# Patient Record
Sex: Female | Born: 1955 | Race: White | Hispanic: No | Smoking: Former smoker
Health system: Southern US, Community
[De-identification: ages and names within clinical notes are randomized; demographics above are authoritative.]

## PROBLEM LIST (undated history)

## (undated) DIAGNOSIS — M549 Dorsalgia, unspecified: Secondary | ICD-10-CM

## (undated) DIAGNOSIS — F39 Unspecified mood [affective] disorder: Secondary | ICD-10-CM

## (undated) DIAGNOSIS — G8929 Other chronic pain: Secondary | ICD-10-CM

## (undated) DIAGNOSIS — M810 Age-related osteoporosis without current pathological fracture: Secondary | ICD-10-CM

## (undated) DIAGNOSIS — Z8619 Personal history of other infectious and parasitic diseases: Secondary | ICD-10-CM

## (undated) DIAGNOSIS — Z9889 Other specified postprocedural states: Secondary | ICD-10-CM

## (undated) DIAGNOSIS — C801 Malignant (primary) neoplasm, unspecified: Secondary | ICD-10-CM

## (undated) DIAGNOSIS — R269 Unspecified abnormalities of gait and mobility: Secondary | ICD-10-CM

## (undated) DIAGNOSIS — Z79899 Other long term (current) drug therapy: Secondary | ICD-10-CM

## (undated) DIAGNOSIS — F419 Anxiety disorder, unspecified: Secondary | ICD-10-CM

## (undated) DIAGNOSIS — I10 Essential (primary) hypertension: Secondary | ICD-10-CM

## (undated) DIAGNOSIS — E7800 Pure hypercholesterolemia, unspecified: Secondary | ICD-10-CM

## (undated) DIAGNOSIS — R87610 Atypical squamous cells of undetermined significance on cytologic smear of cervix (ASC-US): Secondary | ICD-10-CM

## (undated) DIAGNOSIS — C189 Malignant neoplasm of colon, unspecified: Secondary | ICD-10-CM

## (undated) DIAGNOSIS — R251 Tremor, unspecified: Secondary | ICD-10-CM

## (undated) HISTORY — DX: Personal history of other infectious and parasitic diseases: Z86.19

## (undated) HISTORY — PX: COLON SURGERY: SHX602

## (undated) HISTORY — DX: Age-related osteoporosis without current pathological fracture: M81.0

## (undated) HISTORY — DX: Tremor, unspecified: R25.1

## (undated) HISTORY — DX: Atypical squamous cells of undetermined significance on cytologic smear of cervix (ASC-US): R87.610

## (undated) HISTORY — DX: Unspecified abnormalities of gait and mobility: R26.9

## (undated) HISTORY — DX: Other long term (current) drug therapy: Z79.899

## (undated) HISTORY — DX: Unspecified mood (affective) disorder: F39

---

## 2011-07-05 DIAGNOSIS — F39 Unspecified mood [affective] disorder: Secondary | ICD-10-CM | POA: Insufficient documentation

## 2011-09-14 DIAGNOSIS — G894 Chronic pain syndrome: Secondary | ICD-10-CM | POA: Insufficient documentation

## 2013-05-11 DIAGNOSIS — Z720 Tobacco use: Secondary | ICD-10-CM | POA: Insufficient documentation

## 2014-08-08 DIAGNOSIS — R55 Syncope and collapse: Secondary | ICD-10-CM | POA: Insufficient documentation

## 2014-11-29 DIAGNOSIS — R072 Precordial pain: Secondary | ICD-10-CM | POA: Insufficient documentation

## 2015-02-13 DIAGNOSIS — R2689 Other abnormalities of gait and mobility: Secondary | ICD-10-CM | POA: Insufficient documentation

## 2015-02-13 DIAGNOSIS — R159 Full incontinence of feces: Secondary | ICD-10-CM | POA: Insufficient documentation

## 2015-11-03 DIAGNOSIS — M79641 Pain in right hand: Secondary | ICD-10-CM | POA: Insufficient documentation

## 2016-02-25 DIAGNOSIS — M810 Age-related osteoporosis without current pathological fracture: Secondary | ICD-10-CM | POA: Insufficient documentation

## 2017-06-08 ENCOUNTER — Emergency Department (HOSPITAL_BASED_OUTPATIENT_CLINIC_OR_DEPARTMENT_OTHER)
Admission: EM | Admit: 2017-06-08 | Discharge: 2017-06-08 | Disposition: A | Discharge status: Home / Self Care | Payer: Medicare HMO | Attending: Emergency Medicine | Admitting: Emergency Medicine

## 2017-06-08 ENCOUNTER — Encounter (HOSPITAL_BASED_OUTPATIENT_CLINIC_OR_DEPARTMENT_OTHER): Payer: Self-pay

## 2017-06-08 ENCOUNTER — Other Ambulatory Visit: Payer: Self-pay

## 2017-06-08 DIAGNOSIS — I10 Essential (primary) hypertension: Secondary | ICD-10-CM | POA: Diagnosis not present

## 2017-06-08 DIAGNOSIS — Z79899 Other long term (current) drug therapy: Secondary | ICD-10-CM | POA: Insufficient documentation

## 2017-06-08 DIAGNOSIS — F17210 Nicotine dependence, cigarettes, uncomplicated: Secondary | ICD-10-CM | POA: Diagnosis not present

## 2017-06-08 DIAGNOSIS — M79674 Pain in right toe(s): Secondary | ICD-10-CM | POA: Insufficient documentation

## 2017-06-08 HISTORY — DX: Pure hypercholesterolemia, unspecified: E78.00

## 2017-06-08 HISTORY — DX: Essential (primary) hypertension: I10

## 2017-06-08 HISTORY — DX: Other specified postprocedural states: Z98.890

## 2017-06-08 HISTORY — DX: Anxiety disorder, unspecified: F41.9

## 2017-06-08 HISTORY — DX: Malignant (primary) neoplasm, unspecified: C80.1

## 2017-06-08 HISTORY — DX: Other chronic pain: G89.29

## 2017-06-08 HISTORY — DX: Malignant neoplasm of colon, unspecified: C18.9

## 2017-06-08 HISTORY — DX: Dorsalgia, unspecified: M54.9

## 2017-06-08 MED ORDER — DOXYCYCLINE HYCLATE 100 MG PO CAPS: 100.0000 mg | ORAL_CAPSULE | Freq: Two times a day (BID) | ORAL | 0 refills | Status: DC

## 2017-06-08 NOTE — Discharge Instructions (Signed)
You were seen in the emergency department for pain, swelling, and redness to your R big toe. It appears somewhat infected therefore we are putting you on antibiotics- doxycycline, be sure to take this with food. Please take all of your antibiotics until finished. You may develop abdominal discomfort or diarrhea from the antibiotic.  You may help offset this with probiotics which you can buy at the store (ask your pharmacist if unable to find) or get probiotics in the form of eating yogurt. Do not eat or take the probiotics until 2 hours after your antibiotic. If you are unable to tolerate these side effects follow-up with your primary care provider or return to the emergency department.   If you begin to experience any blistering, rashes, swelling, or difficulty breathing seek medical care for evaluation of potentially more serious side effects.   Please be aware that this medication may interact with other medications you are taking, please be sure to discuss your medication list with your pharmacist.   Take motrin or tylenol for discomfort and swelling per over the counter bottle dosing instructions.   Apply warm compresses and perform warm soaks 3 times per day, be sure to dry the area after soaking.   Follow up with the podiatrist listed in your discharge instructions for re-evaluation in 5-7 days. Return to the emergency department for new or worsening symptoms including, but not limited to fever, chills, spreading redness, or any other concerns you may have.   Your blood pressure is somewhat elevated in the emergency department have this rechecked by your primary care provider within the month.   Vitals:   06/08/17 1652  BP: (!) 149/83  Pulse: 90  Resp: 18  Temp: 99.2 F (37.3 C)  SpO2: 98%

## 2017-06-08 NOTE — ED Triage Notes (Signed)
Pt c/o big toenail pain for the last three weeks after her daughter dug under her nails with a metal nail, states her nail is red and swollen

## 2017-06-08 NOTE — ED Provider Notes (Signed)
[facility] EMERGENCY DEPARTMENT Provider Note   CSN: [id number] Arrival date & time: 06/08/17  1648     History   Chief Complaint Chief Complaint  Patient presents with  . Foot Pain    HPI Jessica Larson is a 63 y.o. female with a hx of anxiety, HTN, and tobacco abuse who presents to the ED with complaint of R great toenail discomfort x 3 weeks. Patient states her daughter was cutting her toe nails and used a metal nail to remove some skin at the edge of the nail, no break in the skin occurred- since this incident 3 weeks prior the toe has been uncomfortable. States she developed some swelling and redness. Rates pain a 4/10, worse with pressure or wearing tight shoes, no alleviating factors. Denies fever, chills, or drainage. Denies numbness or weakness.   HPI  Past Medical History:  Diagnosis Date  . Anxiety   . Cancer ([place])   . Chronic back pain   . Colon cancer ([place])   . High cholesterol   . History of loop recorder   . Hypertension     There are no active problems to display for this patient.   Past Surgical History:  Procedure Laterality Date  . COLON SURGERY      OB History    No data available       Home Medications    Prior to Admission medications   Medication Sig Start Date End Date Taking? Authorizing Provider  alprazolam Duanne Moron) 2 MG tablet Take 2 mg by mouth 4 (four) times daily.   Yes [provider]  amitriptyline (ELAVIL) 50 MG tablet Take 50 mg by mouth at bedtime.   Yes [provider]  atenolol (TENORMIN) 50 MG tablet Take 50 mg by mouth 2 (two) times daily.   Yes [provider]  atorvastatin (LIPITOR) 40 MG tablet Take 40 mg by mouth daily.   Yes [provider]  Cholecalciferol (VITAMIN D3) 5000 units TBDP Take by mouth.   Yes [provider]  gabapentin (NEURONTIN) 800 MG tablet Take 800 mg by mouth 4 (four) times daily.   Yes [provider]    Family History History  reviewed. No pertinent family history.  Social History Social History   Tobacco Use  . Smoking status: Heavy Tobacco Smoker  . Smokeless tobacco: Never Used  Substance Use Topics  . Alcohol use: Not on file  . Drug use: Not on file     Allergies   Patient has no known allergies.   Review of Systems Review of Systems  Constitutional: Negative for chills and fever.  Musculoskeletal:       Positive for pain/swelling to R great toe  Skin: Positive for color change (R great toe).  Neurological: Negative for weakness and numbness.     Physical Exam Updated Vital Signs BP (!) 149/83 (BP Location: Left Arm)   Pulse 90   Temp 99.2 F (37.3 C) (Oral)   Resp 18   Ht 5\' 4"  (1.626 m)   Wt 76.7 kg (169 lb)   SpO2 98%   BMI 29.01 kg/m   Physical Exam  Constitutional: She appears well-developed and well-nourished.  Non-toxic appearance. No distress.  HENT:  Head: Normocephalic and atraumatic.  Eyes: Conjunctivae are normal. Right eye exhibits no discharge. Left eye exhibits no discharge.  Cardiovascular: Normal rate and regular rhythm.  No murmur heard. Pulses:      Dorsalis pedis pulses are 2+ on the right side,  and 2+ on the left side.  Pulmonary/Chest: Breath sounds normal. No respiratory distress. She has no wheezes. She has no rales.  Abdominal: Soft. She exhibits no distension. There is no tenderness.  Neurological: She is alert.  Clear speech. Sensation grossly intact to bilateral lower extremities. 5/5 strength with plantar/dorsiflexion bilaterally. Gait intact.   Skin: Skin is warm and dry. Capillary refill takes less than 2 seconds. No rash noted.  R great toe with erythema and swelling to the skin proximal and lateral nail region. This area is tender to palpation. No gross palpable fluctuance. Onychomycotic changes to nails.   Psychiatric: She has a normal mood and affect. Her behavior is normal.  Nursing note and vitals reviewed.    ED Treatments / Results    Labs (all labs ordered are listed, but only abnormal results are displayed) Labs Reviewed - No data to display  EKG  EKG Interpretation None       Radiology No results found.  Procedures Procedures (including critical care time)  Medications Ordered in ED Medications - No data to display   Initial Impression / Assessment and Plan / ED Course  I have reviewed the triage vital signs and the nursing notes.  Pertinent labs & imaging results that were available during my care of the patient were reviewed by me and considered in my medical decision making (see chart for details).   Patient presents with R great toe pain/swelling/erythema. Patient is nontoxic appearing, in no apparent distress, vitals WNL other than somewhat elevated BP- no indication of HTN emergency, discussed with patient need for recheck. Lateral/proximal nail skin with erythema, swelling, and tenderness to palpation. No focal palpable fluctuance. Findings discussed with supervising physician Dr. Lita Mains who personally examined this patient- in agreement with abx therapy and podiatry follow up. Will place patient on doxycycline, recommended warm soaks. I discussed treatment plan, need for PCP and podiatry follow-up, and return precautions with the patient. Provided opportunity for questions, patient confirmed understanding and is in agreement with plan.   Final Clinical Impressions(s) / ED Diagnoses   Final diagnoses:  Great toe pain, right    ED Discharge Orders        Ordered    doxycycline (VIBRAMYCIN) 100 MG capsule  2 times daily     06/08/17 1837       Amaryllis Dyke, PA-C 06/08/17 1903    Julianne Rice, MD 06/11/17 1321

## 2017-09-15 ENCOUNTER — Emergency Department (HOSPITAL_BASED_OUTPATIENT_CLINIC_OR_DEPARTMENT_OTHER)
Admission: EM | Admit: 2017-09-15 | Discharge: 2017-09-15 | Disposition: A | Discharge status: Home / Self Care | Payer: Medicare HMO | Attending: Emergency Medicine | Admitting: Emergency Medicine

## 2017-09-15 ENCOUNTER — Other Ambulatory Visit: Payer: Self-pay

## 2017-09-15 ENCOUNTER — Emergency Department (HOSPITAL_BASED_OUTPATIENT_CLINIC_OR_DEPARTMENT_OTHER): Payer: Medicare HMO

## 2017-09-15 ENCOUNTER — Encounter (HOSPITAL_BASED_OUTPATIENT_CLINIC_OR_DEPARTMENT_OTHER): Payer: Self-pay | Admitting: Emergency Medicine

## 2017-09-15 DIAGNOSIS — M25531 Pain in right wrist: Secondary | ICD-10-CM | POA: Diagnosis not present

## 2017-09-15 DIAGNOSIS — I10 Essential (primary) hypertension: Secondary | ICD-10-CM | POA: Insufficient documentation

## 2017-09-15 DIAGNOSIS — Z87891 Personal history of nicotine dependence: Secondary | ICD-10-CM | POA: Diagnosis not present

## 2017-09-15 DIAGNOSIS — E7800 Pure hypercholesterolemia, unspecified: Secondary | ICD-10-CM | POA: Diagnosis not present

## 2017-09-15 DIAGNOSIS — Z79899 Other long term (current) drug therapy: Secondary | ICD-10-CM | POA: Diagnosis not present

## 2017-09-15 MED ORDER — IBUPROFEN 200 MG PO TABS
600.0000 mg | ORAL_TABLET | Freq: Once | ORAL | Status: AC
  Administered 2017-09-15: 600 mg via ORAL
  Filled 2017-09-15: qty 1

## 2017-09-15 NOTE — ED Provider Notes (Signed)
[facility] EMERGENCY DEPARTMENT Provider Note   CSN: [id number] Arrival date & time: 09/15/17  1607     History   Chief Complaint Chief Complaint  Patient presents with  . Wrist Pain    HPI Jessica Larson is a 62 y.o. female with history of hypertension, chronic back pain, osteoporosis who presents with right wrist pain after catching herself after almost falling on outstretched hand.  She is right-handed.  She began having pain to her right wrist yesterday.  It hurts to move it.  She denies any numbness or tingling.  She took Advil at home without relief.  She also tried heat.  She denies any other injuries.  HPI  Past Medical History:  Diagnosis Date  . Anxiety   . Cancer ([place])   . Chronic back pain   . Colon cancer ([place])   . High cholesterol   . History of loop recorder   . Hypertension     There are no active problems to display for this patient.   Past Surgical History:  Procedure Laterality Date  . COLON SURGERY       OB History   None      Home Medications    Prior to Admission medications   Medication Sig Start Date End Date Taking? Authorizing Provider  alprazolam Duanne Moron) 2 MG tablet Take 2 mg by mouth 4 (four) times daily.    [provider]  amitriptyline (ELAVIL) 50 MG tablet Take 50 mg by mouth at bedtime.    [provider]  atenolol (TENORMIN) 50 MG tablet Take 50 mg by mouth 2 (two) times daily.    [provider]  atorvastatin (LIPITOR) 40 MG tablet Take 40 mg by mouth daily.    [provider]  Cholecalciferol (VITAMIN D3) 5000 units TBDP Take by mouth.    [provider]  doxycycline (VIBRAMYCIN) 100 MG capsule Take 1 capsule (100 mg total) by mouth 2 (two) times daily. 06/08/17   Petrucelli, Samantha R, PA-C  gabapentin (NEURONTIN) 800 MG tablet Take 800 mg by mouth 4 (four) times daily.    [provider]    Family History No family history on file.  Social History Social  History   Tobacco Use  . Smoking status: Former Smoker    Years: 0.00    Last attempt to quit: 09/15/2012    Years since quitting: 5.0  . Smokeless tobacco: Never Used  Substance Use Topics  . Alcohol use: Never    Frequency: Never  . Drug use: Never     Allergies   Patient has no known allergies.   Review of Systems Review of Systems  Musculoskeletal: Positive for arthralgias.  Neurological: Negative for numbness.     Physical Exam Updated Vital Signs BP (!) 146/67 (BP Location: Left Arm)   Pulse 77   Temp 98.9 F (37.2 C) (Oral)   Resp 16   Ht 5\' 5"  (1.651 m)   Wt 77.1 kg (170 lb)   SpO2 97%   BMI 28.29 kg/m   Physical Exam  Constitutional: She appears well-developed and well-nourished. No distress.  HENT:  Head: Normocephalic and atraumatic.  Mouth/Throat: Oropharynx is clear and moist. No oropharyngeal exudate.  Eyes: Pupils are equal, round, and reactive to light. Conjunctivae are normal. Right eye exhibits no discharge. Left eye exhibits no discharge. No scleral icterus.  Neck: Normal range of motion. Neck supple.  Cardiovascular: Normal rate, regular rhythm, normal heart sounds and intact distal pulses. Exam  reveals no gallop and no friction rub.  No murmur heard. Pulmonary/Chest: Effort normal and breath sounds normal. No stridor. No respiratory distress. She has no wheezes. She has no rales.  Musculoskeletal: She exhibits no edema.  R wrist: Tenderness to the dorsal aspect of the right wrist as well as anatomical snuffbox tenderness, pain worse with radial deviation, flexion, and extension, sensation intact, range of motion of digits intact, no significant edema noted  Neurological: She is alert. Coordination normal.  Skin: Skin is warm and dry. No rash noted. She is not diaphoretic. No pallor.  Psychiatric: She has a normal mood and affect.  Nursing note and vitals reviewed.    ED Treatments / Results  Labs (all labs ordered are listed, but only  abnormal results are displayed) Labs Reviewed - No data to display  EKG None  Radiology Dg Wrist Complete Right  Result Date: 09/15/2017 CLINICAL DATA:  Initial evaluation for acute pain at snuffbox status post recent fall. EXAM: RIGHT WRIST - COMPLETE 3+ VIEW COMPARISON:  None. FINDINGS: No acute fracture or dislocation. Normal radiocarpal and distal radioulnar articulations maintained. Corticated osseous fragment at the ulnar styloid is chronic in appearance. Scaphoid intact. No acute soft tissue abnormality. Bones are mildly osteopenic in appearance. IMPRESSION: No acute osseous abnormality about the right wrist. Electronically Signed   By: Jeannine Boga M.D.   On: 09/15/2017 16:55    Procedures Procedures (including critical care time)  SPLINT APPLICATION Date/Time: 4:01 PM Authorized by: Frederica Kuster Consent: Verbal consent obtained. Risks and benefits: risks, benefits and alternatives were discussed Consent given by: patient Splint applied by: EMT Location details: R wrist Splint type: Thumb spica Supplies used: Orthoglass, ACE Post-procedure: The splinted body part was neurovascularly unchanged following the procedure. Patient tolerance: Patient tolerated the procedure well with no immediate complications.     Medications Ordered in ED Medications  ibuprofen (ADVIL,MOTRIN) tablet 600 mg (600 mg Oral Given 09/15/17 1634)     Initial Impression / Assessment and Plan / ED Course  I have reviewed the triage vital signs and the nursing notes.  Pertinent labs & imaging results that were available during my care of the patient were reviewed by me and considered in my medical decision making (see chart for details).     Patient with right wrist pain after [place] a few days ago.  X-ray is negative, however considering anatomical snuffbox tenderness, will place in thumb spica and follow-up to sports medicine for repeat x-ray.  Supportive treatment discussed including  ice, elevation, NSAIDs, Tylenol.  Return precautions discussed.  Patient understands and agrees with plan.  Patient vitals stable throughout ED course and discharged in satisfactory condition. I discussed patient case with Dr. Venora Maples who guided the patient's management and agrees with plan.   Final Clinical Impressions(s) / ED Diagnoses   Final diagnoses:  Right wrist pain    ED Discharge Orders    None       Caryl Ada 09/15/17 1740    Jola Schmidt, MD 09/16/17 0010

## 2017-09-15 NOTE — ED Notes (Signed)
During application of splint patient would not keep wrist  straight and non compliant during EMT placing splint. EMT had to tell her several times to stay still and kept wrist straight or it will hurt worse. Patient kept stating well it hurts now. EMT reassured patient that it hurts while splint it being placed but once its in a splint it will not hurt anymore.

## 2017-09-15 NOTE — ED Notes (Signed)
Pt returned from xray

## 2017-09-15 NOTE — ED Triage Notes (Signed)
Right wrist pain that started yesterday with no known injury.

## 2017-09-15 NOTE — ED Notes (Signed)
Pt instructed on RICE and alternating tylenol and ibuprofen, denies any further needs at this time

## 2017-09-15 NOTE — Discharge Instructions (Addendum)
Keep splint on until you see Dr. Barbaraann Barthel for repeat x-ray and further evaluation of your wrist pain.  Please see him in 7 to 10 days.  You can take ibuprofen or Tylenol as prescribed over-the-counter, as needed for your pain.  Elevate your wrist whenever you are not using it.  Please return the emergency department if you develop any new or worsening symptoms.

## 2017-09-15 NOTE — ED Notes (Signed)
Per registration, pt's ibuprofen has not helped

## 2017-09-15 NOTE — ED Notes (Signed)
ED Provider at bedside. 

## 2018-02-27 DIAGNOSIS — Z79899 Other long term (current) drug therapy: Secondary | ICD-10-CM | POA: Insufficient documentation

## 2018-10-03 ENCOUNTER — Telehealth: Payer: Self-pay | Admitting: *Deleted

## 2018-10-03 ENCOUNTER — Ambulatory Visit: Payer: Medicare HMO | Admitting: Neurology

## 2018-10-03 ENCOUNTER — Encounter: Payer: Self-pay | Admitting: Neurology

## 2018-10-03 NOTE — Telephone Encounter (Signed)
No showed new patient appointment. 

## 2018-10-06 DIAGNOSIS — F411 Generalized anxiety disorder: Secondary | ICD-10-CM | POA: Insufficient documentation

## 2018-10-06 DIAGNOSIS — F1320 Sedative, hypnotic or anxiolytic dependence, uncomplicated: Secondary | ICD-10-CM | POA: Diagnosis present

## 2018-10-06 DIAGNOSIS — Z62820 Parent-biological child conflict: Secondary | ICD-10-CM | POA: Insufficient documentation

## 2018-11-14 ENCOUNTER — Emergency Department (HOSPITAL_COMMUNITY): Payer: Medicare HMO

## 2018-11-14 ENCOUNTER — Encounter (HOSPITAL_COMMUNITY): Payer: Self-pay | Admitting: Emergency Medicine

## 2018-11-14 ENCOUNTER — Inpatient Hospital Stay (HOSPITAL_COMMUNITY)
Admission: EM | Admit: 2018-11-14 | Discharge: 2018-11-17 | DRG: 392 | Disposition: A | Discharge status: Home / Self Care | Payer: Medicare HMO | Attending: Internal Medicine | Admitting: Internal Medicine

## 2018-11-14 ENCOUNTER — Other Ambulatory Visit: Payer: Self-pay

## 2018-11-14 DIAGNOSIS — Z79899 Other long term (current) drug therapy: Secondary | ICD-10-CM

## 2018-11-14 DIAGNOSIS — Z79891 Long term (current) use of opiate analgesic: Secondary | ICD-10-CM

## 2018-11-14 DIAGNOSIS — I10 Essential (primary) hypertension: Secondary | ICD-10-CM | POA: Diagnosis present

## 2018-11-14 DIAGNOSIS — I700 Atherosclerosis of aorta: Secondary | ICD-10-CM | POA: Diagnosis present

## 2018-11-14 DIAGNOSIS — R160 Hepatomegaly, not elsewhere classified: Secondary | ICD-10-CM | POA: Diagnosis present

## 2018-11-14 DIAGNOSIS — Z85038 Personal history of other malignant neoplasm of large intestine: Secondary | ICD-10-CM

## 2018-11-14 DIAGNOSIS — R109 Unspecified abdominal pain: Secondary | ICD-10-CM | POA: Diagnosis not present

## 2018-11-14 DIAGNOSIS — R1110 Vomiting, unspecified: Secondary | ICD-10-CM

## 2018-11-14 DIAGNOSIS — R112 Nausea with vomiting, unspecified: Secondary | ICD-10-CM | POA: Diagnosis not present

## 2018-11-14 DIAGNOSIS — Z7951 Long term (current) use of inhaled steroids: Secondary | ICD-10-CM

## 2018-11-14 DIAGNOSIS — R911 Solitary pulmonary nodule: Secondary | ICD-10-CM | POA: Diagnosis present

## 2018-11-14 DIAGNOSIS — G8929 Other chronic pain: Secondary | ICD-10-CM | POA: Diagnosis present

## 2018-11-14 DIAGNOSIS — R197 Diarrhea, unspecified: Secondary | ICD-10-CM | POA: Diagnosis present

## 2018-11-14 DIAGNOSIS — M549 Dorsalgia, unspecified: Secondary | ICD-10-CM | POA: Diagnosis present

## 2018-11-14 DIAGNOSIS — K760 Fatty (change of) liver, not elsewhere classified: Secondary | ICD-10-CM | POA: Diagnosis present

## 2018-11-14 DIAGNOSIS — Z87891 Personal history of nicotine dependence: Secondary | ICD-10-CM

## 2018-11-14 DIAGNOSIS — Z20828 Contact with and (suspected) exposure to other viral communicable diseases: Secondary | ICD-10-CM | POA: Diagnosis present

## 2018-11-14 DIAGNOSIS — F419 Anxiety disorder, unspecified: Secondary | ICD-10-CM | POA: Diagnosis present

## 2018-11-14 DIAGNOSIS — E785 Hyperlipidemia, unspecified: Secondary | ICD-10-CM | POA: Diagnosis present

## 2018-11-14 LAB — COMPREHENSIVE METABOLIC PANEL
ALT: 15 U/L (ref 0–44)
AST: 15 U/L (ref 15–41)
Albumin: 4 g/dL (ref 3.5–5.0)
Alkaline Phosphatase: 52 U/L (ref 38–126)
Anion gap: 8 (ref 5–15)
BUN: 5 mg/dL — ABNORMAL LOW (ref 8–23)
CO2: 26 mmol/L (ref 22–32)
Calcium: 9 mg/dL (ref 8.9–10.3)
Chloride: 106 mmol/L (ref 98–111)
Creatinine, Ser: 0.59 mg/dL (ref 0.44–1.00)
GFR calc Af Amer: >60 mL/min (ref >60)
GFR calc non Af Amer: >60 mL/min (ref >60)
Glucose, Bld: 96 mg/dL (ref 70–99)
Potassium: 3.4 mmol/L — ABNORMAL LOW (ref 3.5–5.1)
Sodium: 140 mmol/L (ref 135–145)
Total Bilirubin: 0.5 mg/dL (ref 0.3–1.2)
Total Protein: 7.1 g/dL (ref 6.5–8.1)

## 2018-11-14 LAB — CBC WITH DIFFERENTIAL/PLATELET
Abs Immature Granulocytes: 0.06 10*3/uL (ref 0.00–0.07)
Basophils Absolute: 0.1 10*3/uL (ref 0.0–0.1)
Basophils Relative: 1 %
Eosinophils Absolute: 0 10*3/uL (ref 0.0–0.5)
Eosinophils Relative: 0 %
HCT: 37.9 % (ref 36.0–46.0)
Hemoglobin: 12.6 g/dL (ref 12.0–15.0)
Immature Granulocytes: 1 %
Lymphocytes Relative: 22 %
Lymphs Abs: 2 10*3/uL (ref 0.7–4.0)
MCH: 31.1 pg (ref 26.0–34.0)
MCHC: 33.2 g/dL (ref 30.0–36.0)
MCV: 93.6 fL (ref 80.0–100.0)
Monocytes Absolute: 0.5 10*3/uL (ref 0.1–1.0)
Monocytes Relative: 5 %
Neutro Abs: 6.4 10*3/uL (ref 1.7–7.7)
Neutrophils Relative %: 71 %
Platelets: 267 10*3/uL (ref 150–400)
RBC: 4.05 MIL/uL (ref 3.87–5.11)
RDW: 13.1 % (ref 11.5–15.5)
WBC: 9 10*3/uL (ref 4.0–10.5)
nRBC: 0 % (ref 0.0–0.2)

## 2018-11-14 LAB — URINALYSIS, ROUTINE W REFLEX MICROSCOPIC
Bacteria, UA: NONE SEEN
Bilirubin Urine: NEGATIVE
Glucose, UA: NEGATIVE mg/dL
Hgb urine dipstick: NEGATIVE
Ketones, ur: 5 mg/dL — AB
Leukocytes,Ua: NEGATIVE
Nitrite: NEGATIVE
Protein, ur: NEGATIVE mg/dL
Specific Gravity, Urine: 1.006 (ref 1.005–1.030)
pH: 8 (ref 5.0–8.0)

## 2018-11-14 LAB — RAPID URINE DRUG SCREEN, HOSP PERFORMED
Amphetamines: NOT DETECTED
Barbiturates: NOT DETECTED
Benzodiazepines: POSITIVE — AB
Cocaine: NOT DETECTED
Opiates: POSITIVE — AB
Tetrahydrocannabinol: NOT DETECTED

## 2018-11-14 LAB — TROPONIN I (HIGH SENSITIVITY): Troponin I (High Sensitivity): 4 ng/L (ref <18)

## 2018-11-14 LAB — LIPASE, BLOOD: Lipase: 18 U/L (ref 11–51)

## 2018-11-14 MED ORDER — SODIUM CHLORIDE 0.9 % IV SOLN: INTRAVENOUS | Status: DC

## 2018-11-14 MED ORDER — ONDANSETRON HCL 4 MG/2ML IJ SOLN
4.0000 mg | Freq: Once | INTRAMUSCULAR | Status: AC
  Administered 2018-11-14: 19:00:00 4 mg via INTRAVENOUS
  Filled 2018-11-14: qty 2

## 2018-11-14 MED ORDER — MORPHINE SULFATE (PF) 4 MG/ML IV SOLN
4.0000 mg | Freq: Once | INTRAVENOUS | Status: AC
  Administered 2018-11-14: 4 mg via INTRAVENOUS
  Filled 2018-11-14: qty 1

## 2018-11-14 MED ORDER — ACETAMINOPHEN 325 MG PO TABS
650.0000 mg | ORAL_TABLET | Freq: Four times a day (QID) | ORAL | Status: DC | PRN
  Administered 2018-11-15 (×2): 650 mg via ORAL
  Filled 2018-11-14 (×2): qty 2

## 2018-11-14 MED ORDER — HALOPERIDOL LACTATE 5 MG/ML IJ SOLN
1.0000 mg | Freq: Once | INTRAMUSCULAR | Status: AC
  Administered 2018-11-14: 19:00:00 1 mg via INTRAVENOUS
  Filled 2018-11-14: qty 1

## 2018-11-14 MED ORDER — PANTOPRAZOLE SODIUM 40 MG PO TBEC: 40.0000 mg | DELAYED_RELEASE_TABLET | Freq: Two times a day (BID) | ORAL | Status: DC | PRN

## 2018-11-14 MED ORDER — GABAPENTIN 400 MG PO CAPS
800.0000 mg | ORAL_CAPSULE | Freq: Three times a day (TID) | ORAL | Status: DC
  Administered 2018-11-14 – 2018-11-17 (×6): 800 mg via ORAL
  Filled 2018-11-14 (×7): qty 2

## 2018-11-14 MED ORDER — CYCLOBENZAPRINE HCL 10 MG PO TABS
10.0000 mg | ORAL_TABLET | Freq: Every day | ORAL | Status: DC | PRN
  Administered 2018-11-15: 10 mg via ORAL
  Filled 2018-11-14: qty 1

## 2018-11-14 MED ORDER — SODIUM CHLORIDE 0.9 % IV BOLUS
1000.0000 mL | Freq: Once | INTRAVENOUS | Status: AC
Start: 2018-11-14 — End: 2018-11-14
  Administered 2018-11-14: 16:00:00 1000 mL via INTRAVENOUS

## 2018-11-14 MED ORDER — AMITRIPTYLINE HCL 25 MG PO TABS
50.0000 mg | ORAL_TABLET | Freq: Every day | ORAL | Status: DC
  Administered 2018-11-14 – 2018-11-16 (×3): 50 mg via ORAL
  Filled 2018-11-14 (×3): qty 2

## 2018-11-14 MED ORDER — ACETAMINOPHEN 650 MG RE SUPP: 650.0000 mg | Freq: Four times a day (QID) | RECTAL | Status: DC | PRN

## 2018-11-14 MED ORDER — ALPRAZOLAM 0.5 MG PO TABS
2.0000 mg | ORAL_TABLET | Freq: Once | ORAL | Status: AC
  Administered 2018-11-14: 20:00:00 2 mg via ORAL
  Filled 2018-11-14: qty 4

## 2018-11-14 MED ORDER — PROMETHAZINE HCL 25 MG/ML IJ SOLN: 12.5000 mg | Freq: Once | INTRAMUSCULAR | Status: DC

## 2018-11-14 MED ORDER — POTASSIUM CHLORIDE IN NACL 20-0.9 MEQ/L-% IV SOLN
INTRAVENOUS | Status: AC
  Administered 2018-11-14: 22:00:00 via INTRAVENOUS
  Filled 2018-11-14: qty 1000

## 2018-11-14 MED ORDER — ATENOLOL 50 MG PO TABS
50.0000 mg | ORAL_TABLET | Freq: Two times a day (BID) | ORAL | Status: DC
  Administered 2018-11-15 – 2018-11-16 (×2): 50 mg via ORAL
  Filled 2018-11-14 (×6): qty 1

## 2018-11-14 MED ORDER — PROMETHAZINE HCL 25 MG/ML IJ SOLN
12.5000 mg | Freq: Four times a day (QID) | INTRAMUSCULAR | Status: DC | PRN
  Administered 2018-11-14 – 2018-11-15 (×2): 12.5 mg via INTRAVENOUS
  Filled 2018-11-14 (×2): qty 1

## 2018-11-14 MED ORDER — ENOXAPARIN SODIUM 40 MG/0.4ML ~~LOC~~ SOLN
40.0000 mg | SUBCUTANEOUS | Status: DC
  Administered 2018-11-14 – 2018-11-16 (×3): 40 mg via SUBCUTANEOUS
  Filled 2018-11-14 (×3): qty 0.4

## 2018-11-14 MED ORDER — ONDANSETRON HCL 4 MG/2ML IJ SOLN
4.0000 mg | Freq: Once | INTRAMUSCULAR | Status: AC
  Administered 2018-11-14: 4 mg via INTRAVENOUS
  Filled 2018-11-14: qty 2

## 2018-11-14 MED ORDER — ALPRAZOLAM 1 MG PO TABS
2.0000 mg | ORAL_TABLET | Freq: Four times a day (QID) | ORAL | Status: DC
  Administered 2018-11-14 – 2018-11-17 (×10): 2 mg via ORAL
  Filled 2018-11-14 (×10): qty 2

## 2018-11-14 MED ORDER — ONDANSETRON HCL 4 MG/2ML IJ SOLN: 4.0000 mg | Freq: Four times a day (QID) | INTRAMUSCULAR | Status: DC | PRN

## 2018-11-14 NOTE — H&P (Addendum)
TRH H&P    Patient Demographics:    Jessica Larson, is a 63 y.o. female  MRN: [id number]  DOB - 29-Mar-1956  Admit Date - 11/14/2018  Referring MD/NP/PA:  Marye Round  Outpatient Primary MD for the patient is System, Pcp Not In  Patient coming from: home  Chief complaint- nausea and vommitting   HPI:    Jessica Larson  is a 63 y.o. female,  w anxiety, hypertension, hyperlipidemia, chronic back pain, apparently presents with c/o n/v, since last Sunday. Slight diarrhea, intermittent.  Pt denies fever, chills, cough, cp, palp, sob, brbpr, black stool.  Pt denies opioid withdrawal.  Pt denies nsaids.  Pt states that she has had prior colonoscopy w polyps, and egd w esohpageal dilation. Pt denies dysphagia at this time.   In ED,  T 98.7  Po 63  R 20  Bp 147/67 pox 98% Wt 72.4kg  Xray Abd IMPRESSION: 1. Nonobstructive bowel gas pattern. 2. Possible hepatomegaly.  The length of the liver measures 21 cm. 3. Diffuse coarsening of the interstitium may be due to chronic interstitial lung changes.    Na 140, K 3.4, Bun 5, Creatinine 0.59 Ast 15, Alt 15, Alk phos 52. T bili 0.5 Wbc 9.0, Hgb 12.6, Plt 267 Urinalysis negative Lipase 18  Review of visit at [facility] 11/10/2018 CT chest / Abd/ pelvis CHEST - Thoracic inlet: Visualized thyroid is unremarkable. The central airways patent. - Heart: Heart size is within normal limits. No pericardial effusion. - Pulmonary arteries/Vessels: No evidence of pulmonary embolism. No acute thoracic aortic abnormality. No aneurysm.  Atherosclerosis of multiple arterial structures. - Mediastinum/hila: Similar patulous esophagus with air-fluid level. Right hilar node measures 11 mm in short axis. - Lungs: Micronodular nodularity in the right lower lobe with scattered groundglass opacities in the lung bases. Nodular densities measure up to 9 mm in the right lower lobe (series 5, image  72). Additional 8 mm nodule in the right lower lobe (series 5,  image 60). 6 mm nodule on image 55. - Pleura: No pleural effusion. No pneumothorax.  ABDOMEN: - Liver: Hepatic steatosis with fatty sparing along the gallbladder fossa. - Gallbladder/biliary system: Within normal limits. - Spleen: Within normal limits. - Pancreas: Within normal limits. - Adrenal glands: Within normal limits. - Kidneys: Suspect nonobstructing bilateral nephrolithiasis versus possible early contrast excretion. No hydronephrosis. - GI tract: No bowel obstruction. Mildly patulous esophagus. Possible tiny hiatal hernia. No abnormal fecal retention. - Appendix: No acute appendicitis.  - Peritoneum/retroperitoneum: No free fluid. No pneumoperitoneum. Small retroperitoneal lymph nodes are nonspecific. - Vessels: Atherosclerosis of the abdominal aorta and branch vessels.  PELVIS: - Bladder: Within normal limits. - Ureters: Within normal limits. - Reproductive: Within normal limits.  MUSCULOSKELETAL: - No acute osseous findings. Implanted foreign body in the left chest. Mild bilateral hip degenerative changes. Multilevel degenerative changes of the spine. Levoconvex curvature of the lumbar spine.  SUPPORT APPARATUS: N.A.   IMPRESSION: 1.  Groundglass and nodular opacities in the lung bases presumably relate to infectious/inflammatory etiology. However, nodules measure up to 9  mm. Recommend follow-up CT after treatment to document resolution. 2.  No evidence of acute pulmonary embolism. 3.  Hepatic steatosis. 4.  Atherosclerosis.  Pt will be admitted for intractable n/v.        Review of systems:    In addition to the HPI above,  No Fever-chills, No Headache, No changes with Vision or hearing, No problems swallowing food or Liquids, No Chest pain, Cough or Shortness of Breath,  No Blood in stool or Urine, No dysuria, No new skin rashes or bruises, No new joints pains-aches,  No new weakness,  tingling, numbness in any extremity, No recent weight gain or loss, No polyuria, polydypsia or polyphagia, No significant Mental Stressors.  All other systems reviewed and are negative.    Past History of the following :    Past Medical History:  Diagnosis Date  . Anxiety   . Cancer ([place])   . Chronic back pain   . Colon cancer ([place])   . High cholesterol   . History of loop recorder   . Hypertension       Past Surgical History:  Procedure Laterality Date  . COLON SURGERY        Social History:      Social History   Tobacco Use  . Smoking status: Former Smoker    Years: 0.00    Quit date: 09/15/2012    Years since quitting: 6.1  . Smokeless tobacco: Never Used  Substance Use Topics  . Alcohol use: Never    Frequency: Never       Family History :    No family history on file. Negative for inflammatory bowel disease   Home Medications:   Prior to Admission medications   Medication Sig Start Date End Date Taking? Authorizing Provider  alprazolam Duanne Moron) 2 MG tablet Take 2 mg by mouth 4 (four) times daily.   Yes [provider]  amitriptyline (ELAVIL) 50 MG tablet Take 50 mg by mouth at bedtime.   Yes [provider]  atenolol (TENORMIN) 50 MG tablet Take 50 mg by mouth 2 (two) times daily.   Yes [provider]  Cholecalciferol (VITAMIN D3) 5000 units TBDP Take 1 tablet by mouth daily.    Yes [provider]  cyclobenzaprine (FLEXERIL) 10 MG tablet Take 10 mg by mouth daily as needed for muscle spasms.  11/04/18  Yes [provider]  fluticasone (FLONASE) 50 MCG/ACT nasal spray Place 1 spray into both nostrils daily as needed for allergies.  06/20/18  Yes [provider]  gabapentin (NEURONTIN) 800 MG tablet Take 800 mg by mouth 3 (three) times daily.    Yes [provider]  HYDROcodone-acetaminophen (NORCO/VICODIN) 5-325 MG tablet Take 1 tablet by mouth every 6 (six) hours as needed for moderate pain  or severe pain.  11/10/18  Yes [provider]  pantoprazole (PROTONIX) 40 MG tablet Take 40 mg by mouth 2 (two) times daily as needed (indigestion).  09/02/18  Yes [provider]  promethazine (PHENERGAN) 25 MG tablet Take 25 mg by mouth every 8 (eight) hours as needed for nausea or vomiting.  10/16/18  Yes [provider]  doxycycline (VIBRAMYCIN) 100 MG capsule Take 1 capsule (100 mg total) by mouth 2 (two) times daily. Patient not taking: Reported on 11/14/2018 06/08/17   Petrucelli, Glynda Jaeger, PA-C     Allergies:    No Known Allergies   Physical Exam:   Vitals  Blood pressure (!) 144/59, pulse (!) 56, temperature  98.8 F (37.1 C), temperature source Oral, resp. rate 20, height '5\' 4"'  (1.626 m), weight 72.4 kg, SpO2 92 %.  1.  General: aoxo3  2. Psychiatric: euthymic  3. Neurologic: cn2-12 intact, reflexes 2+ symmetric, diffuse with downgoing toes bilaterally, motor 5/5 in all 4 ext  4. HEENMT:  Anicteric, pupils 1.4m symmetric, direct, consensual, near intact Neck: no jvd  5. Respiratory : CTAB  6. Cardiovascular : rrr s1, s2,   7. Gastrointestinal:  Abd: soft, nt, nd, +bs  8. Skin:  Ext:  No c/c/e,    9.Musculoskeletal:  Good ROM,  No adenopathy    Data Review:    CBC Recent Labs  Lab 11/14/18 1608  WBC 9.0  HGB 12.6  HCT 37.9  PLT 267  MCV 93.6  MCH 31.1  MCHC 33.2  RDW 13.1  LYMPHSABS 2.0  MONOABS 0.5  EOSABS 0.0  BASOSABS 0.1   ------------------------------------------------------------------------------------------------------------------  Results for orders placed or performed during the hospital encounter of 11/14/18 (from the past 48 hour(s))  Comprehensive metabolic panel     Status: Abnormal   Collection Time: 11/14/18  4:08 PM  Result Value Ref Range   Sodium 140 135 - 145 mmol/L   Potassium 3.4 (L) 3.5 - 5.1 mmol/L   Chloride 106 98 - 111 mmol/L   CO2 26 22 - 32 mmol/L   Glucose, Bld 96 70 - 99 mg/dL    BUN 5 (L) 8 - 23 mg/dL   Creatinine, Ser 0.59 0.44 - 1.00 mg/dL   Calcium 9.0 8.9 - 10.3 mg/dL   Total Protein 7.1 6.5 - 8.1 g/dL   Albumin 4.0 3.5 - 5.0 g/dL   AST 15 15 - 41 U/L   ALT 15 0 - 44 U/L   Alkaline Phosphatase 52 38 - 126 U/L   Total Bilirubin 0.5 0.3 - 1.2 mg/dL   GFR calc non Af Amer >60 >60 mL/min   GFR calc Af Amer >60 >60 mL/min   Anion gap 8 5 - 15    Comment: [facility] [address] [place] [id number] Lipase, blood     Status: None   Collection Time: 11/14/18  4:08 PM  Result Value Ref Range   Lipase 18 11 - 51 U/L    Comment: [facility]formed at [facility] [address] [place] [id number] CBC with Diff     Status: None   Collection Time: 11/14/18  4:08 PM  Result Value Ref Range   WBC 9.0 4.0 - 10.5 K/uL   RBC 4.05 3.87 - 5.11 MIL/uL   Hemoglobin 12.6 12.0 - 15.0 g/dL   HCT 37.9 36.0 - 46.0 %   MCV 93.6 80.0 - 100.0 fL   MCH 31.1 26.0 - 34.0 pg   MCHC 33.2 30.0 - 36.0 g/dL   RDW 13.1 11.5 - 15.5 %   Platelets 267 150 - 400 K/uL   nRBC 0.0 0.0 - 0.2 %   Neutrophils Relative % 71 %   Neutro Abs 6.4 1.7 - 7.7 K/uL   Lymphocytes Relative 22 %   Lymphs Abs 2.0 0.7 - 4.0 K/uL   Monocytes Relative 5 %   Monocytes Absolute 0.5 0.1 - 1.0 K/uL   Eosinophils Relative 0 %   Eosinophils Absolute 0.0 0.0 - 0.5 K/uL   Basophils Relative 1 %   Basophils Absolute 0.1 0.0 - 0.1 K/uL   Immature Granulocytes 1 %   Abs Immature Granulocytes  0.06 0.00 - 0.07 K/uL    Comment: Performed at [facility], [place] [address], [place], [place] [address]  Troponin I (High Sensitivity)     Status: None   Collection Time: 11/14/18  6:54 PM  Result Value Ref Range   Troponin I (High Sensitivity) 4 <18 ng/L    Comment: (NOTE) Elevated high sensitivity troponin I (hsTnI) values and significant  changes across serial measurements may suggest ACS but many other  chronic and acute conditions  are known to elevate hsTnI results.  Refer to the "Links" section for chest pain algorithms and additional  guidance. Performed at [facility], [place] [address] [place], [place] [address]   Urinalysis, Routine w reflex microscopic     Status: Abnormal   Collection Time: 11/14/18  7:45 PM  Result Value Ref Range   Color, Urine STRAW (A) YELLOW   APPearance CLEAR CLEAR   Specific Gravity, Urine 1.006 1.005 - 1.030   pH 8.0 5.0 - 8.0   Glucose, UA NEGATIVE NEGATIVE mg/dL   Hgb urine dipstick NEGATIVE NEGATIVE   Bilirubin Urine NEGATIVE NEGATIVE   Ketones, ur 5 (A) NEGATIVE mg/dL   Protein, ur NEGATIVE NEGATIVE mg/dL   Nitrite NEGATIVE NEGATIVE   Leukocytes,Ua NEGATIVE NEGATIVE   RBC / HPF 0-5 0 - 5 RBC/hpf   WBC, UA 0-5 0 - 5 WBC/hpf   Bacteria, UA NONE SEEN NONE SEEN   Squamous Epithelial / LPF 0-5 0 - 5    Comment: Performed at [facility], [place] [address] [place], [place] [address]  Rapid urine drug screen (hospital performed)     Status: Abnormal   Collection Time: 11/14/18  7:45 PM  Result Value Ref Range   Opiates POSITIVE (A) NONE DETECTED   Cocaine NONE DETECTED NONE DETECTED   Benzodiazepines POSITIVE (A) NONE DETECTED   Amphetamines NONE DETECTED NONE DETECTED   Tetrahydrocannabinol NONE DETECTED NONE DETECTED   Barbiturates NONE DETECTED NONE DETECTED    Comment: (NOTE) DRUG SCREEN FOR MEDICAL PURPOSES ONLY.  IF CONFIRMATION IS NEEDED FOR ANY PURPOSE, NOTIFY LAB WITHIN 5 DAYS. LOWEST DETECTABLE LIMITS FOR URINE DRUG SCREEN Drug Class                     Cutoff (ng/mL) Amphetamine and metabolites    1000 Barbiturate and metabolites    200 Benzodiazepine                 798 Tricyclics and metabolites     300 Opiates and metabolites        300 Cocaine and metabolites        300 THC                            50 Performed at [facility], [place] [address], [place], [place] [address]     Chemistries   Recent Labs  Lab 11/14/18 1608  NA 140  K 3.4*  CL 106  CO2 26  GLUCOSE 96  BUN 5*  CREATININE 0.59  CALCIUM 9.0  AST 15  ALT 15  ALKPHOS 52  BILITOT 0.5   ------------------------------------------------------------------------------------------------------------------  ------------------------------------------------------------------------------------------------------------------ GFR: Estimated Creatinine Clearance: 69.3 mL/min (by C-G formula based on SCr of 0.59 mg/dL). Liver Function Tests: Recent Labs  Lab 11/14/18 1608  AST 15  ALT 15  ALKPHOS 52  BILITOT 0.5  PROT 7.1  ALBUMIN 4.0   Recent Labs  Lab 11/14/18 1608  LIPASE 18   No results for input(s): AMMONIA in the last 168 hours. Coagulation Profile: No results for input(s): INR, PROTIME in the last 168 hours. Cardiac Enzymes: No results for input(s): CKTOTAL, CKMB, CKMBINDEX, TROPONINI in the last 168 hours. BNP (last 3 results) No results for input(s): PROBNP in the last 8760 hours. HbA1C: No results for input(s): HGBA1C in the last 72 hours. CBG: No results for input(s): GLUCAP in the last 168 hours. Lipid Profile: No results for input(s): CHOL, HDL, LDLCALC, TRIG, CHOLHDL, LDLDIRECT in the last 72 hours. Thyroid Function Tests: No results for input(s): TSH, T4TOTAL, FREET4, T3FREE, THYROIDAB in the last 72 hours. Anemia Panel: No results for input(s): VITAMINB12, FOLATE, FERRITIN, TIBC, IRON, RETICCTPCT in the last 72 hours.  --------------------------------------------------------------------------------------------------------------- Urine analysis:    Component Value Date/Time   COLORURINE STRAW (A) 11/14/2018 1945   APPEARANCEUR CLEAR 11/14/2018 1945   LABSPEC 1.006 11/14/2018 1945   PHURINE 8.0 11/14/2018 1945   GLUCOSEU NEGATIVE 11/14/2018 1945   HGBUR NEGATIVE 11/14/2018 1945   BILIRUBINUR NEGATIVE 11/14/2018 1945   KETONESUR 5 (A) 11/14/2018 1945   PROTEINUR NEGATIVE  11/14/2018 1945   NITRITE NEGATIVE 11/14/2018 1945   LEUKOCYTESUR NEGATIVE 11/14/2018 1945      Imaging Results:    Dg Abd Acute W/chest  Result Date: 11/14/2018 CLINICAL DATA:  Left upper quadrant abdominal pain. EXAM: DG ABDOMEN ACUTE W/ 1V CHEST COMPARISON:  None. FINDINGS: There is no evidence of dilated bowel loops or free intraperitoneal air. No radiopaque calculi. Prominent size of the liver. Heart size and mediastinal contours are within normal limits. Loop recorder in place. Calcific atherosclerotic disease of the aorta. Diffuse coarsening of the interstitium. IMPRESSION: 1. Nonobstructive bowel gas pattern. 2. Possible hepatomegaly.  The length of the liver measures 21 cm. 3. Diffuse coarsening of the interstitium may be due to chronic interstitial lung changes. Electronically Signed   By: Fidela Salisbury M.D.   On: 11/14/2018 16:05      Assessment & Plan:    Active Problems:   Nausea & vomiting   Chronic back pain   Essential hypertension   Pulmonary nodule  Nausea and vomitting NPO except for sips with medications Cont PPI Zofran 65m iv q6h prn  Phenergan prn if Zofran ineffective Consider gastric emptying study Consider GI consult in am if patient not feeling better  Hypertension Cont Atenolol 587mpo bid  Hepatomegaly, Fatty liver RUQ ultrasound  Chronic back pain Cont Gabapentin 8007mo tid Cont Flexeril 25m27m tid prn   Anxiety Cont Xanax 2mg 81mqid Cont Elavil 50mg 1mhs  Pulmonary Nodule Outpatient follow up please  DVT Prophylaxis-   Lovenox - SCDs   AM Labs Ordered, also please review Full Orders  Family Communication: Admission, patients condition and plan of care including tests being ordered have been discussed with the patient  who indicate understanding and agree with the plan and Code Status.  Code Status:  FULL CODE  Admission status: Observation: Based on patients clinical presentation and evaluation of above clinical data, I  have made determination that patient meets Observation criteria at this time.  Time spent in minutes : 70   Jessica Araque Jani Graveln 11/14/2018 at 9:47 PM

## 2018-11-14 NOTE — ED Notes (Signed)
Called and gave Jessica Larson for this pt going to 1407. Transport taking pt to room now.

## 2018-11-14 NOTE — ED Triage Notes (Signed)
Per EMS, patient from home, c/o LUQ abdominal pain N/V/D x4 days. Seen at [facility] for same x4 days ago. States worsening. Reports prescribed oxycodone and phenergan however unable to keep down. Refused to walk with EMS.  20g R AC 4mg  Zofran R AC

## 2018-11-14 NOTE — ED Notes (Signed)
ED TO INPATIENT HANDOFF REPORT  ED Nurse Name and Phone #: [place], [place] Name/Age/Gender Jessica Larson 63 y.o. female Room/Bed: WA22/WA22  Code Status   Code Status: Full Code  Home/SNF/Other Home Patient oriented to: self, place, time and situation Is this baseline? Yes   Triage Complete: Triage complete  Chief Complaint abdominal pain  Triage Note Per EMS, patient from home, c/o LUQ abdominal pain N/V/D x4 days. Seen at [facility] for same x4 days ago. States worsening. Reports prescribed oxycodone and phenergan however unable to keep down. Refused to walk with EMS.  20g R AC 4mg  Zofran R AC    Allergies No Known Allergies  Level of Care/Admitting Diagnosis ED Disposition    ED Disposition Condition Comment   Admit  Hospital Area: [place] [[id number]]  Level of Care: Telemetry [5]  Admit to tele based on following criteria: Monitor for Ischemic changes  Covid Evaluation: Asymptomatic Screening Protocol (No Symptoms)  Diagnosis: Nausea & vomiting [676195]  Admitting Physician: Jani Gravel [[id number]]  Attending Physician: Jani Gravel [[id number]]  PT Class (Do Not Modify): Observation [104]  PT Acc Code (Do Not Modify): Observation [10022]       B Medical/Surgery History Past Medical History:  Diagnosis Date  . Anxiety   . Cancer ([place])   . Chronic back pain   . Colon cancer ([place])   . High cholesterol   . History of loop recorder   . Hypertension    Past Surgical History:  Procedure Laterality Date  . COLON SURGERY       A IV Location/Drains/Wounds Patient Lines/Drains/Airways Status   Active Line/Drains/Airways    Name:   Placement date:   Placement time:   Site:   Days:   Peripheral IV 11/14/18 Left Forearm   11/14/18    1616    Forearm   less than 1          Intake/Output Last 24 hours  Intake/Output Summary (Last 24 hours) at 11/14/2018 1940 Last data filed at 11/14/2018 1938 Gross per 24 hour  Intake 1000 ml  Output -   Net 1000 ml    Labs/Imaging Results for orders placed or performed during the hospital encounter of 11/14/18 (from the past 48 hour(s))  Comprehensive metabolic panel     Status: Abnormal   Collection Time: 11/14/18  4:08 PM  Result Value Ref Range   Sodium 140 135 - 145 mmol/L   Potassium 3.4 (L) 3.5 - 5.1 mmol/L   Chloride 106 98 - 111 mmol/L   CO2 26 22 - 32 mmol/L   Glucose, Bld 96 70 - 99 mg/dL   BUN 5 (L) 8 - 23 mg/dL   Creatinine, Ser 0.59 0.44 - 1.00 mg/dL   Calcium 9.0 8.9 - 10.3 mg/dL   Total Protein 7.1 6.5 - 8.1 g/dL   Albumin 4.0 3.5 - 5.0 g/dL   AST 15 15 - 41 U/L   ALT 15 0 - 44 U/L   Alkaline Phosphatase 52 38 - 126 U/L   Total Bilirubin 0.5 0.3 - 1.2 mg/dL   GFR calc non Af Amer >60 >60 mL/min   GFR calc Af Amer >60 >60 mL/min   Anion gap 8 5 - 15    Comment: Performed at [facility], [place] [address] [place], [place] [address]  Lipase, blood     Status: None   Collection Time: 11/14/18  4:08 PM  Result Value Ref Range   Lipase 18  11 - 51 U/L    Comment: Performed at [facility], [place] [address], [place], [place] [address]  CBC with Diff     Status: None   Collection Time: 11/14/18  4:08 PM  Result Value Ref Range   WBC 9.0 4.0 - 10.5 K/uL   RBC 4.05 3.87 - 5.11 MIL/uL   Hemoglobin 12.6 12.0 - 15.0 g/dL   HCT 37.9 36.0 - 46.0 %   MCV 93.6 80.0 - 100.0 fL   MCH 31.1 26.0 - 34.0 pg   MCHC 33.2 30.0 - 36.0 g/dL   RDW 13.1 11.5 - 15.5 %   Platelets 267 150 - 400 K/uL   nRBC 0.0 0.0 - 0.2 %   Neutrophils Relative % 71 %   Neutro Abs 6.4 1.7 - 7.7 K/uL   Lymphocytes Relative 22 %   Lymphs Abs 2.0 0.7 - 4.0 K/uL   Monocytes Relative 5 %   Monocytes Absolute 0.5 0.1 - 1.0 K/uL   Eosinophils Relative 0 %   Eosinophils Absolute 0.0 0.0 - 0.5 K/uL   Basophils Relative 1 %   Basophils Absolute 0.1 0.0 - 0.1 K/uL   Immature Granulocytes 1 %   Abs Immature Granulocytes 0.06 0.00 - 0.07 K/uL    Comment: Performed at  [facility], [place] [address] [place], [place] [address]   Dg Abd Acute W/chest  Result Date: 11/14/2018 CLINICAL DATA:  Left upper quadrant abdominal pain. EXAM: DG ABDOMEN ACUTE W/ 1V CHEST COMPARISON:  None. FINDINGS: There is no evidence of dilated bowel loops or free intraperitoneal air. No radiopaque calculi. Prominent size of the liver. Heart size and mediastinal contours are within normal limits. Loop recorder in place. Calcific atherosclerotic disease of the aorta. Diffuse coarsening of the interstitium. IMPRESSION: 1. Nonobstructive bowel gas pattern. 2. Possible hepatomegaly.  The length of the liver measures 21 cm. 3. Diffuse coarsening of the interstitium may be due to chronic interstitial lung changes. Electronically Signed   By: Fidela Salisbury M.D.   On: 11/14/2018 16:05    Pending Labs Unresulted Labs (From admission, onward)    Start     Ordered   11/21/18 0500  Creatinine, serum  (enoxaparin (LOVENOX)    CrCl >/= 30 ml/min)  Weekly,   R    Comments: while on enoxaparin therapy    11/14/18 1856   11/15/18 0500  Comprehensive metabolic panel  Tomorrow morning,   R     11/14/18 1856   11/15/18 0500  CBC  Tomorrow morning,   R     11/14/18 1856   11/14/18 1855  Rapid urine drug screen (hospital performed)  ONCE - STAT,   STAT     11/14/18 1854   11/14/18 1854  HIV antibody (Routine Testing)  Once,   STAT     11/14/18 1856   11/14/18 1845  SARS CORONAVIRUS 2 Nasal Swab Aptima Multi Swab  (Asymptomatic/Tier 2 Patients Labs)  Once,   STAT    Question Answer Comment  Is this test for diagnosis or screening Screening   Symptomatic for COVID-19 as defined by CDC No   Hospitalized for COVID-19 No   Admitted to ICU for COVID-19 No   Previously tested for COVID-19 No   Resident in a congregate (group) care setting No   Employed in healthcare setting No   Pregnant No      11/14/18 1844   11/14/18 1525  Urinalysis, Routine w reflex microscopic  ONCE -  STAT,   STAT     11/14/18 1524          Vitals/Pain Today's Vitals   11/14/18 1435 11/14/18 1438 11/14/18 1700 11/14/18 1745  BP:  (!) 147/67 (!) 145/59 (!) 157/52  Pulse:  63 (!) 57 66  Resp:  20  16  Temp:  98.7 F (37.1 C)    SpO2:  98% 100% 100%  PainSc: 10-Worst pain ever       Isolation Precautions No active isolations  Medications Medications  sodium chloride 0.9 % bolus 1,000 mL (0 mLs Intravenous Stopped 11/14/18 1938)    And  0.9 %  sodium chloride infusion (has no administration in time range)  ALPRAZolam (XANAX) tablet 2 mg (has no administration in time range)  promethazine (PHENERGAN) injection 12.5 mg (has no administration in time range)  enoxaparin (LOVENOX) injection 40 mg (has no administration in time range)  acetaminophen (TYLENOL) tablet 650 mg (has no administration in time range)    Or  acetaminophen (TYLENOL) suppository 650 mg (has no administration in time range)  0.9 % NaCl with KCl 20 mEq/ L  infusion (has no administration in time range)  ondansetron (ZOFRAN) injection 4 mg (has no administration in time range)  promethazine (PHENERGAN) injection 12.5 mg (has no administration in time range)  morphine 4 MG/ML injection 4 mg (4 mg Intravenous Given 11/14/18 1609)  ondansetron (ZOFRAN) injection 4 mg (4 mg Intravenous Given 11/14/18 1608)  ondansetron (ZOFRAN) injection 4 mg (4 mg Intravenous Given 11/14/18 1900)  morphine 4 MG/ML injection 4 mg (4 mg Intravenous Given 11/14/18 1902)  haloperidol lactate (HALDOL) injection 1 mg (1 mg Intravenous Given 11/14/18 1900)    Mobility walks Low fall risk

## 2018-11-14 NOTE — ED Provider Notes (Signed)
[facility] DEPT Provider Note   CSN: [id number] Arrival date & time: 11/14/18  1426    History   Chief Complaint Chief Complaint  Patient presents with   Abdominal Pain    HPI Jessica Larson is a 63 y.o. female.     HPI Pt presents to the ED for trouble with persistent nausea and vomiting.  THis has been ongoing for several days.  Pt states she is throwing up dry phlegm.  Pt denies any diarrhea with these symptoms.  SHe does have pain in her left upper abdomen to her back.  Pt states she is also feeling anxious.   She takes xanax regularly.  SHe vomited up her last dose.  Pt feels like she is getting very anxous.  SHe was seen at [facility] a few days ago.  SHe had CT scans of her chest and abdomen.  Pt came to the ED here because of the wait at [facility]. Past Medical History:  Diagnosis Date   Anxiety    Cancer ([place])    Chronic back pain    Colon cancer ([place])    High cholesterol    History of loop recorder    Hypertension     There are no active problems to display for this patient.   Past Surgical History:  Procedure Laterality Date   COLON SURGERY       OB History   No obstetric history on file.      Home Medications    Prior to Admission medications   Medication Sig Start Date End Date Taking? Authorizing Provider  alprazolam Duanne Moron) 2 MG tablet Take 2 mg by mouth 4 (four) times daily.   Yes [provider]  amitriptyline (ELAVIL) 50 MG tablet Take 50 mg by mouth at bedtime.   Yes [provider]  atenolol (TENORMIN) 50 MG tablet Take 50 mg by mouth 2 (two) times daily.   Yes [provider]  Cholecalciferol (VITAMIN D3) 5000 units TBDP Take 1 tablet by mouth daily.    Yes [provider]  cyclobenzaprine (FLEXERIL) 10 MG tablet Take 10 mg by mouth daily as needed for muscle spasms.  11/04/18  Yes [provider]  fluticasone (FLONASE) 50 MCG/ACT nasal spray Place 1 spray into  both nostrils daily as needed for allergies.  06/20/18  Yes [provider]  gabapentin (NEURONTIN) 800 MG tablet Take 800 mg by mouth 3 (three) times daily.    Yes [provider]  HYDROcodone-acetaminophen (NORCO/VICODIN) 5-325 MG tablet Take 1 tablet by mouth every 6 (six) hours as needed for moderate pain or severe pain.  11/10/18  Yes [provider]  pantoprazole (PROTONIX) 40 MG tablet Take 40 mg by mouth 2 (two) times daily as needed (indigestion).  09/02/18  Yes [provider]  promethazine (PHENERGAN) 25 MG tablet Take 25 mg by mouth every 8 (eight) hours as needed for nausea or vomiting.  10/16/18  Yes [provider]  doxycycline (VIBRAMYCIN) 100 MG capsule Take 1 capsule (100 mg total) by mouth 2 (two) times daily. Patient not taking: Reported on 11/14/2018 06/08/17   Petrucelli, Glynda Jaeger, PA-C    Family History No family history on file.  Social History Social History   Tobacco Use   Smoking status: Former Smoker    Years: 0.00    Quit date: 09/15/2012    Years since quitting: 6.1   Smokeless tobacco: Never Used  Substance Use Topics   Alcohol use: Never  Frequency: Never   Drug use: Never     Allergies   Patient has no known allergies.   Review of Systems Review of Systems  All other systems reviewed and are negative.    Physical Exam Updated Vital Signs BP (!) 157/52    Pulse 66    Temp 98.7 F (37.1 C)    Resp 16    SpO2 100%   Physical Exam Vitals signs and nursing note reviewed.  Constitutional:      Appearance: She is well-developed. She is ill-appearing.  HENT:     Head: Normocephalic and atraumatic.     Right Ear: External ear normal.     Left Ear: External ear normal.  Eyes:     General: No scleral icterus.       Right eye: No discharge.        Left eye: No discharge.     Conjunctiva/sclera: Conjunctivae normal.  Neck:     Musculoskeletal: Neck supple.     Trachea: No tracheal deviation.    Cardiovascular:     Rate and Rhythm: Normal rate and regular rhythm.  Pulmonary:     Effort: Pulmonary effort is normal. No respiratory distress.     Breath sounds: Normal breath sounds. No stridor. No wheezing or rales.  Abdominal:     General: Bowel sounds are normal. There is no distension.     Palpations: Abdomen is soft.     Tenderness: There is abdominal tenderness in the epigastric area. There is no guarding or rebound.  Musculoskeletal:        General: No tenderness.  Skin:    General: Skin is warm and dry.     Findings: No rash.  Neurological:     Mental Status: She is alert.     Cranial Nerves: No cranial nerve deficit (no facial droop, extraocular movements intact, no slurred speech).     Sensory: No sensory deficit.     Motor: No abnormal muscle tone or seizure activity.     Coordination: Coordination normal.      ED Treatments / Results  Labs (all labs ordered are listed, but only abnormal results are displayed) Labs Reviewed  COMPREHENSIVE METABOLIC PANEL - Abnormal; Notable for the following components:      Result Value   Potassium 3.4 (*)    BUN 5 (*)    All other components within normal limits  SARS CORONAVIRUS 2  LIPASE, BLOOD  CBC WITH DIFFERENTIAL/PLATELET  URINALYSIS, ROUTINE W REFLEX MICROSCOPIC    EKG None  Radiology Dg Abd Acute W/chest  Result Date: 11/14/2018 CLINICAL DATA:  Left upper quadrant abdominal pain. EXAM: DG ABDOMEN ACUTE W/ 1V CHEST COMPARISON:  None. FINDINGS: There is no evidence of dilated bowel loops or free intraperitoneal air. No radiopaque calculi. Prominent size of the liver. Heart size and mediastinal contours are within normal limits. Loop recorder in place. Calcific atherosclerotic disease of the aorta. Diffuse coarsening of the interstitium. IMPRESSION: 1. Nonobstructive bowel gas pattern. 2. Possible hepatomegaly.  The length of the liver measures 21 cm. 3. Diffuse coarsening of the interstitium may be due to chronic  interstitial lung changes. Electronically Signed   By: Fidela Salisbury M.D.   On: 11/14/2018 16:05    Procedures Procedures (including critical care time)  Medications Ordered in ED Medications  sodium chloride 0.9 % bolus 1,000 mL (1,000 mLs Intravenous New Bag/Given 11/14/18 1617)    And  0.9 %  sodium chloride infusion (has no administration in time  range)  ALPRAZolam (XANAX) tablet 2 mg (has no administration in time range)  ondansetron (ZOFRAN) injection 4 mg (has no administration in time range)  morphine 4 MG/ML injection 4 mg (has no administration in time range)  promethazine (PHENERGAN) injection 12.5 mg (has no administration in time range)  haloperidol lactate (HALDOL) injection 1 mg (has no administration in time range)  morphine 4 MG/ML injection 4 mg (4 mg Intravenous Given 11/14/18 1609)  ondansetron (ZOFRAN) injection 4 mg (4 mg Intravenous Given 11/14/18 1608)     Initial Impression / Assessment and Plan / ED Course  I have reviewed the triage vital signs and the nursing notes.  Pertinent labs & imaging results that were available during my care of the patient were reviewed by me and considered in my medical decision making (see chart for details).  Clinical Course as of Nov 14 1843  Tue Nov 14, 2018  1515 CT scan [facility] chest and abd at [facility] on 8[facility]13: IMPRESSION: 1. Groundglass and nodular opacities in the lung bases presumably relate to infectious/inflammatory etiology. However, nodules measure up to 9 mm. Recommend follow-up CT after treatment to document resolution. 2. No evidence of acute pulmonary embolism. 3. Hepatic steatosis. 4. Atherosclerosis.   [HE]  1740 Pt has asked several times for something for her nerves.  Explained to her we will start for something for the nausea and pain and see if that helps.   [CX]  4481 EHUDJSHFW with daughter.  Pt is switching PCPs.  She has another rx of hyrdrocodone to pick up.  She still has hr xanax rx.    [YO]    3785 Daughter states she has been having diarrhea for months.   Many of these symptoms have been ongoing for months.   [YI]  5027 Labs reviewed.  No acute changes noted.  Urinalysis is pending.   [XA]  1287 Acute abdominal series without signs of obstruction.   [OM]  7672 Patient tried to drink fluids.  She started vomiting again.   [JK]    Clinical Course User Index [JK] Dorie Rank, MD     Patient presents with persistent abdominal pain and vomiting.  Patient was just seen a few days ago at another medical facility.  The records were reviewed.  Patient had a CT scan of her chest as well as her abdomen and pelvis.  There is some nodules noted in the lungs but no acute finding was noted in her abdominal pelvic region.  Patient continues to vomit here.  She continues to complain of pain.  She has not been able to keep down anything orally.  I am primarily seeing dry heaves and small amounts of emesis.  Wonder if the patient may be having some issues with gastroparesis.  She is on chronic opiates and benzodiazepines and I wonder if that could be a factor although the patient's daughter states she has not run out of any of her medications.  As the patient is unable to keep down any fluids I will consult the medical service to see if we can bring her in overnight for hydration and observation.  Final Clinical Impressions(s) / ED Diagnoses   Final diagnoses:  Intractable vomiting, presence of nausea not specified, unspecified vomiting type  Abdominal pain, unspecified abdominal location      Dorie Rank, MD 11/14/18 [phone]

## 2018-11-15 ENCOUNTER — Observation Stay (HOSPITAL_COMMUNITY): Payer: Medicare HMO

## 2018-11-15 ENCOUNTER — Encounter (HOSPITAL_COMMUNITY): Payer: Self-pay | Admitting: *Deleted

## 2018-11-15 DIAGNOSIS — R911 Solitary pulmonary nodule: Secondary | ICD-10-CM

## 2018-11-15 DIAGNOSIS — Z7951 Long term (current) use of inhaled steroids: Secondary | ICD-10-CM | POA: Diagnosis not present

## 2018-11-15 DIAGNOSIS — F419 Anxiety disorder, unspecified: Secondary | ICD-10-CM | POA: Diagnosis present

## 2018-11-15 DIAGNOSIS — M549 Dorsalgia, unspecified: Secondary | ICD-10-CM | POA: Diagnosis not present

## 2018-11-15 DIAGNOSIS — I10 Essential (primary) hypertension: Secondary | ICD-10-CM

## 2018-11-15 DIAGNOSIS — Z85038 Personal history of other malignant neoplasm of large intestine: Secondary | ICD-10-CM | POA: Diagnosis not present

## 2018-11-15 DIAGNOSIS — K760 Fatty (change of) liver, not elsewhere classified: Secondary | ICD-10-CM | POA: Diagnosis present

## 2018-11-15 DIAGNOSIS — R112 Nausea with vomiting, unspecified: Secondary | ICD-10-CM | POA: Diagnosis present

## 2018-11-15 DIAGNOSIS — Z87891 Personal history of nicotine dependence: Secondary | ICD-10-CM | POA: Diagnosis not present

## 2018-11-15 DIAGNOSIS — Z79899 Other long term (current) drug therapy: Secondary | ICD-10-CM | POA: Diagnosis not present

## 2018-11-15 DIAGNOSIS — G8929 Other chronic pain: Secondary | ICD-10-CM

## 2018-11-15 DIAGNOSIS — E785 Hyperlipidemia, unspecified: Secondary | ICD-10-CM | POA: Diagnosis present

## 2018-11-15 DIAGNOSIS — Z79891 Long term (current) use of opiate analgesic: Secondary | ICD-10-CM | POA: Diagnosis not present

## 2018-11-15 DIAGNOSIS — R197 Diarrhea, unspecified: Secondary | ICD-10-CM | POA: Diagnosis present

## 2018-11-15 DIAGNOSIS — R160 Hepatomegaly, not elsewhere classified: Secondary | ICD-10-CM | POA: Diagnosis present

## 2018-11-15 DIAGNOSIS — I700 Atherosclerosis of aorta: Secondary | ICD-10-CM | POA: Diagnosis present

## 2018-11-15 DIAGNOSIS — R109 Unspecified abdominal pain: Secondary | ICD-10-CM | POA: Diagnosis present

## 2018-11-15 DIAGNOSIS — Z20828 Contact with and (suspected) exposure to other viral communicable diseases: Secondary | ICD-10-CM | POA: Diagnosis present

## 2018-11-15 LAB — SARS CORONAVIRUS 2 (TAT 6-24 HRS): SARS Coronavirus 2: NEGATIVE

## 2018-11-15 LAB — CBC
HCT: 38.7 % (ref 36.0–46.0)
Hemoglobin: 12.2 g/dL (ref 12.0–15.0)
MCH: 30.8 pg (ref 26.0–34.0)
MCHC: 31.5 g/dL (ref 30.0–36.0)
MCV: 97.7 fL (ref 80.0–100.0)
Platelets: 230 10*3/uL (ref 150–400)
RBC: 3.96 MIL/uL (ref 3.87–5.11)
RDW: 13.5 % (ref 11.5–15.5)
WBC: 7.3 10*3/uL (ref 4.0–10.5)
nRBC: 0 % (ref 0.0–0.2)

## 2018-11-15 LAB — COMPREHENSIVE METABOLIC PANEL
ALT: 12 U/L (ref 0–44)
AST: 12 U/L — ABNORMAL LOW (ref 15–41)
Albumin: 3.2 g/dL — ABNORMAL LOW (ref 3.5–5.0)
Alkaline Phosphatase: 42 U/L (ref 38–126)
Anion gap: 6 (ref 5–15)
BUN: 5 mg/dL — ABNORMAL LOW (ref 8–23)
CO2: 24 mmol/L (ref 22–32)
Calcium: 8 mg/dL — ABNORMAL LOW (ref 8.9–10.3)
Chloride: 110 mmol/L (ref 98–111)
Creatinine, Ser: 0.55 mg/dL (ref 0.44–1.00)
GFR calc Af Amer: >60 mL/min (ref >60)
GFR calc non Af Amer: >60 mL/min (ref >60)
Glucose, Bld: 94 mg/dL (ref 70–99)
Potassium: 3.4 mmol/L — ABNORMAL LOW (ref 3.5–5.1)
Sodium: 140 mmol/L (ref 135–145)
Total Bilirubin: 0.4 mg/dL (ref 0.3–1.2)
Total Protein: 5.9 g/dL — ABNORMAL LOW (ref 6.5–8.1)

## 2018-11-15 LAB — HIV ANTIBODY (ROUTINE TESTING W REFLEX): HIV Screen 4th Generation wRfx: NONREACTIVE

## 2018-11-15 MED ORDER — POTASSIUM CHLORIDE IN NACL 20-0.9 MEQ/L-% IV SOLN
INTRAVENOUS | Status: AC
  Administered 2018-11-15 (×2): via INTRAVENOUS
  Filled 2018-11-15 (×2): qty 1000

## 2018-11-15 MED ORDER — HYDROCODONE-ACETAMINOPHEN 5-325 MG PO TABS
1.0000 | ORAL_TABLET | Freq: Four times a day (QID) | ORAL | Status: DC | PRN
  Administered 2018-11-15 – 2018-11-16 (×2): 1 via ORAL
  Filled 2018-11-15 (×2): qty 1

## 2018-11-15 MED ORDER — MORPHINE SULFATE (PF) 2 MG/ML IV SOLN
2.0000 mg | Freq: Once | INTRAVENOUS | Status: AC | PRN
  Administered 2018-11-15: 2 mg via INTRAVENOUS
  Filled 2018-11-15: qty 1

## 2018-11-15 NOTE — Plan of Care (Signed)
  Problem: Education: Goal: Knowledge of General Education information will improve Description: Including pain rating scale, medication(s)/side effects and non-pharmacologic comfort measures Outcome: Completed/Met   Problem: Clinical Measurements: Goal: Ability to maintain clinical measurements within normal limits will improve Outcome: Progressing Goal: Will remain free from infection Outcome: Progressing Goal: Diagnostic test results will improve Outcome: Progressing Goal: Respiratory complications will improve Outcome: Completed/Met Goal: Cardiovascular complication will be avoided Outcome: Progressing   Problem: Coping: Goal: Level of anxiety will decrease Outcome: Completed/Met   Problem: Skin Integrity: Goal: Risk for impaired skin integrity will decrease Outcome: Completed/Met

## 2018-11-15 NOTE — Progress Notes (Signed)
PROGRESS NOTE    Jessica Larson  KOE:[id number] DOB: [birth year] DOA: 11/14/2018 PCP: System, Pcp Not In   Brief Narrative:   Jessica Larson  is a 63 y.o. female,  w anxiety, hypertension, hyperlipidemia, chronic back pain, apparently presents with c/o n/v, since last "Sunday. Slight diarrhea, intermittent.  Pt denies fever, chills, cough, cp, palp, sob, brbpr, black stool.  Pt denies opioid withdrawal.  Pt denies nsaids.  Pt states that she has had prior colonoscopy w polyps, and egd w esohpageal dilation. Pt denies dysphagia at this time.    Assessment & Plan:   Active Problems:   Nausea & vomiting   Chronic back pain   Essential hypertension   Pulmonary nodule   Nausea and vomiting   Nausea and vomiting with diarrhea. Patient reports vomiting and diarrhea has essentially resolved, Still somewhat nauseous, although indicated she like to try to advance diet. We will advance to full liquids, continue PPI, continue antiemetics Consider outpatient GI work-up if patient continues to improve  Hypertension: Continue patient's home medication of atenolol 50 mg p.o. twice daily  Hepatomegaly: Patient will need continued outpatient surveillance  Chronic back pain: Continue gabapentin 800 mg p.o. 3 times daily Continue Flexeril  Anxiety Patient been treated with Elavil and Xanax which we will continue  Pulmonary nodule in a patient with history of smoking Will need outpatient follow-up.    DVT prophylaxis: Lovenox SQ  Code Status: FULL    Code Status Orders  (From admission, onward)         Start     Ordered   11/14/18 1856  Full code  Continuous     08" /18/20 1856        Code Status History    This patient has a current code status but no historical code status.   Advance Care Planning Activity     Family Communication: Discussed with patient's daughter Javier Glazier Disposition Plan:   Patient be changed to inpatient as she is requiring IV pain meds and IV  antiemetics.  Will monitor additional night if stable hopefully discharge tomorrow Consults called: None Admission status: Inpatient   Consultants:   None  Procedures:  Dg Abd Acute W/chest  Result Date: 11/14/2018 CLINICAL DATA:  Left upper quadrant abdominal pain. EXAM: DG ABDOMEN ACUTE W/ 1V CHEST COMPARISON:  None. FINDINGS: There is no evidence of dilated bowel loops or free intraperitoneal air. No radiopaque calculi. Prominent size of the liver. Heart size and mediastinal contours are within normal limits. Loop recorder in place. Calcific atherosclerotic disease of the aorta. Diffuse coarsening of the interstitium. IMPRESSION: 1. Nonobstructive bowel gas pattern. 2. Possible hepatomegaly.  The length of the liver measures 21 cm. 3. Diffuse coarsening of the interstitium may be due to chronic interstitial lung changes. Electronically Signed   By: Fidela Salisbury M.D.   On: 11/14/2018 16:05   US Abdomen Limited Ruq  Result Date: 11/15/2018 CLINICAL DATA:  Nausea and vomiting. EXAM: ULTRASOUND ABDOMEN LIMITED RIGHT UPPER QUADRANT COMPARISON:  None. FINDINGS: Gallbladder: Non mobile echogenic focus along the gallbladder wall likely reflecting a polyp. No cholelithiasis. No pericholecystic fluid. Negative sonographic Murphy sign. Common bile duct: Diameter: 3 mm Liver: No focal lesion identified. Within normal limits in parenchymal echogenicity. Portal vein is patent on color Doppler imaging with normal direction of blood flow towards the liver. Other: None. IMPRESSION: No cholelithiasis or sonographic evidence of acute cholecystitis. Electronically Signed   By: Kathreen Devoid   On: 11/15/2018 09:50  Antimicrobials:   None   Subjective: Patient reports vomiting and diarrhea is improved Still does feel somewhat nauseous requiring IV antiemetics  Objective: Vitals:   11/15/18 0500 11/15/18 0510 11/15/18 1019 11/15/18 1320  BP:  124/66  132/74  Pulse:  (!) 52 66 (!) 51  Resp:  17   16  Temp:  98.3 F (36.8 C)  98.8 F (37.1 C)  TempSrc:  Oral  Oral  SpO2:  96%  97%  Weight: 73.5 kg     Height:        Intake/Output Summary (Last 24 hours) at 11/15/2018 1419 Last data filed at 11/15/2018 1000 Gross per 24 hour  Intake 2157.62 ml  Output -  Net 2157.62 ml   Filed Weights   11/14/18 2019 11/15/18 0500  Weight: 72.4 kg 73.5 kg    Examination:  General exam: Appears calm and comfortable  Respiratory system: Clear to auscultation. Respiratory effort normal. Cardiovascular system: S1 & S2 heard, RRR. No JVD, murmurs, rubs, gallops or clicks. No pedal edema. Gastrointestinal system: Abdomen is nondistended, soft and mildly tender without focal findings. Central nervous system: Alert and oriented. No focal neurological deficits. Extremities: Warm and well perfused, no contractures Skin: No rashes, lesions or ulcers Psychiatry: Judgement and insight appear normal. Mood & affect appropriate.     Data Reviewed: I have personally reviewed following labs and imaging studies  CBC: Recent Labs  Lab 11/14/18 1608 11/15/18 0503  WBC 9.0 7.3  NEUTROABS 6.4  --   HGB 12.6 12.2  HCT 37.9 38.7  MCV 93.6 97.7  PLT 267 831   Basic Metabolic Panel: Recent Labs  Lab 11/14/18 1608 11/15/18 0503  NA 140 140  K 3.4* 3.4*  CL 106 110  CO2 26 24  GLUCOSE 96 94  BUN 5* 5*  CREATININE 0.59 0.55  CALCIUM 9.0 8.0*   GFR: Estimated Creatinine Clearance: 69.8 mL/min (by C-G formula based on SCr of 0.55 mg/dL). Liver Function Tests: Recent Labs  Lab 11/14/18 1608 11/15/18 0503  AST 15 12*  ALT 15 12  ALKPHOS 52 42  BILITOT 0.5 0.4  PROT 7.1 5.9*  ALBUMIN 4.0 3.2*   Recent Labs  Lab 11/14/18 1608  LIPASE 18   No results for input(s): AMMONIA in the last 168 hours. Coagulation Profile: No results for input(s): INR, PROTIME in the last 168 hours. Cardiac Enzymes: No results for input(s): CKTOTAL, CKMB, CKMBINDEX, TROPONINI in the last 168 hours. BNP  (last 3 results) No results for input(s): PROBNP in the last 8760 hours. HbA1C: No results for input(s): HGBA1C in the last 72 hours. CBG: No results for input(s): GLUCAP in the last 168 hours. Lipid Profile: No results for input(s): CHOL, HDL, LDLCALC, TRIG, CHOLHDL, LDLDIRECT in the last 72 hours. Thyroid Function Tests: No results for input(s): TSH, T4TOTAL, FREET4, T3FREE, THYROIDAB in the last 72 hours. Anemia Panel: No results for input(s): VITAMINB12, FOLATE, FERRITIN, TIBC, IRON, RETICCTPCT in the last 72 hours. Sepsis Labs: No results for input(s): PROCALCITON, LATICACIDVEN in the last 168 hours.  Recent Results (from the past 240 hour(s))  SARS CORONAVIRUS 2 Nasal Swab Aptima Multi Swab     Status: None   Collection Time: 11/14/18  7:14 PM   Specimen: Aptima Multi Swab; Nasal Swab  Result Value Ref Range Status   SARS Coronavirus 2 NEGATIVE NEGATIVE Final    Comment: (NOTE) SARS-CoV-2 target nucleic acids are NOT DETECTED. The SARS-CoV-2 RNA is generally detectable in upper and lower respiratory specimens  during the acute phase of infection. Negative results do not preclude SARS-CoV-2 infection, do not rule out co-infections with other pathogens, and should not be used as the sole basis for treatment or other patient management decisions. Negative results must be combined with clinical observations, patient history, and epidemiological information. The expected result is Negative. Fact Sheet for Patients: SugarRoll.be Fact Sheet for Healthcare Providers: https://www.woods-mathews.com/ This test is not yet approved or cleared by the [place] FDA and  has been authorized for detection and/or diagnosis of SARS-CoV-2 by FDA under an Emergency Use Authorization (EUA). This EUA will remain  in effect (meaning this test can be used) for the duration of the COVID-19 declaration under Section 56 4(b)(1) of the Act, 21 U.S.C.  section 360bbb-3(b)(1), unless the authorization is terminated or revoked sooner. Performed at [facility], [place] [address], [place], [place] [address]          Radiology Studies: Dg Abd Acute W/chest  Result Date: 11/14/2018 CLINICAL DATA:  Left upper quadrant abdominal pain. EXAM: DG ABDOMEN ACUTE W/ 1V CHEST COMPARISON:  None. FINDINGS: There is no evidence of dilated bowel loops or free intraperitoneal air. No radiopaque calculi. Prominent size of the liver. Heart size and mediastinal contours are within normal limits. Loop recorder in place. Calcific atherosclerotic disease of the aorta. Diffuse coarsening of the interstitium. IMPRESSION: 1. Nonobstructive bowel gas pattern. 2. Possible hepatomegaly.  The length of the liver measures 21 cm. 3. Diffuse coarsening of the interstitium may be due to chronic interstitial lung changes. Electronically Signed   By: Fidela Salisbury M.D.   On: 11/14/2018 16:05   US Abdomen Limited Ruq  Result Date: 11/15/2018 CLINICAL DATA:  Nausea and vomiting. EXAM: ULTRASOUND ABDOMEN LIMITED RIGHT UPPER QUADRANT COMPARISON:  None. FINDINGS: Gallbladder: Non mobile echogenic focus along the gallbladder wall likely reflecting a polyp. No cholelithiasis. No pericholecystic fluid. Negative sonographic Murphy sign. Common bile duct: Diameter: 3 mm Liver: No focal lesion identified. Within normal limits in parenchymal echogenicity. Portal vein is patent on color Doppler imaging with normal direction of blood flow towards the liver. Other: None. IMPRESSION: No cholelithiasis or sonographic evidence of acute cholecystitis. Electronically Signed   By: Kathreen Devoid   On: 11/15/2018 09:50        Scheduled Meds: . alprazolam  2 mg Oral QID  . amitriptyline  50 mg Oral QHS  . atenolol  50 mg Oral BID  . enoxaparin (LOVENOX) injection  40 mg Subcutaneous Q24H  . gabapentin  800 mg Oral TID  . promethazine  12.5 mg Intravenous Once   Continuous Infusions:  . 0.9 % NaCl with KCl 20 mEq / L 100 mL/hr at 11/15/18 0759     LOS: 0 days    Time spent: [place], MD [facility]  If 7PM-7AM, please contact night-coverage  11/15/2018, 2:19 PM

## 2018-11-16 LAB — CBC WITH DIFFERENTIAL/PLATELET
Abs Immature Granulocytes: 0.02 10*3/uL (ref 0.00–0.07)
Basophils Absolute: 0.1 10*3/uL (ref 0.0–0.1)
Basophils Relative: 1 %
Eosinophils Absolute: 0.1 10*3/uL (ref 0.0–0.5)
Eosinophils Relative: 2 %
HCT: 37.7 % (ref 36.0–46.0)
Hemoglobin: 12 g/dL (ref 12.0–15.0)
Immature Granulocytes: 0 %
Lymphocytes Relative: 34 %
Lymphs Abs: 2.5 10*3/uL (ref 0.7–4.0)
MCH: 30.9 pg (ref 26.0–34.0)
MCHC: 31.8 g/dL (ref 30.0–36.0)
MCV: 97.2 fL (ref 80.0–100.0)
Monocytes Absolute: 0.4 10*3/uL (ref 0.1–1.0)
Monocytes Relative: 6 %
Neutro Abs: 4.4 10*3/uL (ref 1.7–7.7)
Neutrophils Relative %: 57 %
Platelets: 234 10*3/uL (ref 150–400)
RBC: 3.88 MIL/uL (ref 3.87–5.11)
RDW: 13.8 % (ref 11.5–15.5)
WBC: 7.5 10*3/uL (ref 4.0–10.5)
nRBC: 0 % (ref 0.0–0.2)

## 2018-11-16 LAB — BASIC METABOLIC PANEL
Anion gap: 7 (ref 5–15)
BUN: 5 mg/dL — ABNORMAL LOW (ref 8–23)
CO2: 22 mmol/L (ref 22–32)
Calcium: 7.9 mg/dL — ABNORMAL LOW (ref 8.9–10.3)
Chloride: 112 mmol/L — ABNORMAL HIGH (ref 98–111)
Creatinine, Ser: 0.5 mg/dL (ref 0.44–1.00)
GFR calc Af Amer: >60 mL/min (ref >60)
GFR calc non Af Amer: >60 mL/min (ref >60)
Glucose, Bld: 93 mg/dL (ref 70–99)
Potassium: 3.8 mmol/L (ref 3.5–5.1)
Sodium: 141 mmol/L (ref 135–145)

## 2018-11-16 NOTE — Discharge Summary (Signed)
Physician Discharge Summary  Jessica Larson IRJ:[id number] DOB: [birth year] DOA: 11/14/2018  PCP: System, Pcp Not In  Admit date: 11/14/2018 Discharge date: 11/16/2018  Admitted From: Inpatient Disposition: home  Recommendations for Outpatient Follow-up:  1. Follow up with PCP in 1-2 weeks 2. Please obtain BMP/CBC in one week  Home Health:No Equipment/Devices:none  Discharge Condition:Stable CODE STATUS:Full code Diet recommendation: Resume preadmission diet  Brief/Interim Summary: LindaCliftonis a63 y.o.female,w anxiety, hypertension, hyperlipidemia, chronic back pain, apparently presents with c/o n/v, since last Sunday. Slight diarrhea, intermittent. Pt denies fever, chills, cough, cp, palp, sob, brbpr, black stool. Pt denies opioid withdrawal. Pt denies nsaids. Pt states that she has had prior colonoscopy w polyps, and egd w esohpageal dilation. Pt denies dysphagia at this time.  Hospital course:ausea and vomiting with diarrhea. Patient reports vomiting and diarrhea has essentially resolved with significant improvement in nausea although still with decreased appetite Advanced diet which patient tolerated although had decreased appetite Extensive discussion on supplemental nutrition with Ensure products. Patient is scheduled to follow-up with GI as an outpatient and will missed her last appointment because of transportation difficulties.  Encouraged her to reschedule and follow-up as discussed  Hypertension: Continue patient's home medication of atenolol 50 mg p.o. twice daily discharge without change  Hepatomegaly: Patient will need continued outpatient surveillance Patient follow-up with GI as above  Chronic back pain: Continue gabapentin 800 mg p.o. 3 times daily Patient's gabapentin was listed as 2400 at night.  Encouraged patient to take as above and 803 times a day Continue Flexeril  Anxiety Patient been treated with Elavil and Xanax which we continued  inpatient Patient would likely benefit from further outpatient mental health evaluation for management of anxiety  Pulmonary nodule in a patient with history of smoking CT done at [facility] on November 10, 2018 identified multiple nodules on the lungs Will need close outpatient follow-up was discussed with patient at length.   Discharge Diagnoses:  Active Problems:   Nausea & vomiting   Chronic back pain   Essential hypertension   Pulmonary nodule   Nausea and vomiting    Discharge Instructions  Discharge Instructions    Call MD for:   Complete by: As directed    Any acute change in medical condition   Call MD for:  persistant nausea and vomiting   Complete by: As directed    Call MD for:  severe uncontrolled pain   Complete by: As directed    Call MD for:  temperature >100.4   Complete by: As directed    Diet - low sodium heart healthy   Complete by: As directed    Increase activity slowly   Complete by: As directed      Allergies as of 11/16/2018   No Known Allergies     Medication List    STOP taking these medications   doxycycline 100 MG capsule Commonly known as: VIBRAMYCIN     TAKE these medications   alprazolam 2 MG tablet Commonly known as: XANAX Take 2 mg by mouth 4 (four) times daily.   amitriptyline 50 MG tablet Commonly known as: ELAVIL Take 50 mg by mouth at bedtime.   atenolol 50 MG tablet Commonly known as: TENORMIN Take 50 mg by mouth 2 (two) times daily.   cyclobenzaprine 10 MG tablet Commonly known as: FLEXERIL Take 10 mg by mouth daily as needed for muscle spasms.   fluticasone 50 MCG/ACT nasal spray Commonly known as: FLONASE Place 1 spray into both nostrils daily as needed for  allergies.   gabapentin 800 MG tablet Commonly known as: NEURONTIN Take 800 mg by mouth 3 (three) times daily.   HYDROcodone-acetaminophen 5-325 MG tablet Commonly known as: NORCO/VICODIN Take 1 tablet by mouth every 6 (six) hours as needed for moderate  pain or severe pain.   pantoprazole 40 MG tablet Commonly known as: PROTONIX Take 40 mg by mouth 2 (two) times daily as needed (indigestion).   promethazine 25 MG tablet Commonly known as: PHENERGAN Take 25 mg by mouth every 8 (eight) hours as needed for nausea or vomiting.   Vitamin D3 125 MCG (5000 UT) Tbdp Take 1 tablet by mouth daily.       No Known Allergies  Consultations:  None   Procedures/Studies: Dg Abd Acute W/chest  Result Date: 11/14/2018 CLINICAL DATA:  Left upper quadrant abdominal pain. EXAM: DG ABDOMEN ACUTE W/ 1V CHEST COMPARISON:  None. FINDINGS: There is no evidence of dilated bowel loops or free intraperitoneal air. No radiopaque calculi. Prominent size of the liver. Heart size and mediastinal contours are within normal limits. Loop recorder in place. Calcific atherosclerotic disease of the aorta. Diffuse coarsening of the interstitium. IMPRESSION: 1. Nonobstructive bowel gas pattern. 2. Possible hepatomegaly.  The length of the liver measures 21 cm. 3. Diffuse coarsening of the interstitium may be due to chronic interstitial lung changes. Electronically Signed   By: Fidela Salisbury M.D.   On: 11/14/2018 16:05   US Abdomen Limited Ruq  Result Date: 11/15/2018 CLINICAL DATA:  Nausea and vomiting. EXAM: ULTRASOUND ABDOMEN LIMITED RIGHT UPPER QUADRANT COMPARISON:  None. FINDINGS: Gallbladder: Non mobile echogenic focus along the gallbladder wall likely reflecting a polyp. No cholelithiasis. No pericholecystic fluid. Negative sonographic Murphy sign. Common bile duct: Diameter: 3 mm Liver: No focal lesion identified. Within normal limits in parenchymal echogenicity. Portal vein is patent on color Doppler imaging with normal direction of blood flow towards the liver. Other: None. IMPRESSION: No cholelithiasis or sonographic evidence of acute cholecystitis. Electronically Signed   By: Kathreen Devoid   On: 11/15/2018 09:50       Subjective: Patient reported feels  much better nausea vomiting and diarrhea resolved, Reports still has poor appetite, encouraged nutritional supplementation with products like Ensure  Discharge Exam: Vitals:   11/16/18 0600 11/16/18 0848  BP: (!) 145/76 (!) 154/73  Pulse: (!) 57 (!) 57  Resp: 16   Temp: 98.3 F (36.8 C)   SpO2: 100%    Vitals:   11/15/18 2139 11/16/18 0500 11/16/18 0600 11/16/18 0848  BP: (!) 153/69  (!) 145/76 (!) 154/73  Pulse: (!) 48  (!) 57 (!) 57  Resp:   16   Temp:   98.3 F (36.8 C)   TempSrc:   Oral   SpO2:   100%   Weight:  72.2 kg    Height:        General: Pt is alert, awake, not in acute distress Cardiovascular: RRR, S1/S2 +, no rubs, no gallops Respiratory: CTA bilaterally, no wheezing, no rhonchi Abdominal: Soft, NT, ND, bowel sounds + Extremities: no edema, no cyanosis    The results of significant diagnostics from this hospitalization (including imaging, microbiology, ancillary and laboratory) are listed below for reference.     Microbiology: Recent Results (from the past 240 hour(s))  SARS CORONAVIRUS 2 Nasal Swab Aptima Multi Swab     Status: None   Collection Time: 11/14/18  7:14 PM   Specimen: Aptima Multi Swab; Nasal Swab  Result Value Ref Range Status  SARS Coronavirus 2 NEGATIVE NEGATIVE Final    Comment: (NOTE) SARS-CoV-2 target nucleic acids are NOT DETECTED. The SARS-CoV-2 RNA is generally detectable in upper and lower respiratory specimens during the acute phase of infection. Negative results do not preclude SARS-CoV-2 infection, do not rule out co-infections with other pathogens, and should not be used as the sole basis for treatment or other patient management decisions. Negative results must be combined with clinical observations, patient history, and epidemiological information. The expected result is Negative. Fact Sheet for Patients: SugarRoll.be Fact Sheet for Healthcare  Providers: https://www.woods-mathews.com/ This test is not yet approved or cleared by the [place] FDA and  has been authorized for detection and/or diagnosis of SARS-CoV-2 by FDA under an Emergency Use Authorization (EUA). This EUA will remain  in effect (meaning this test can be used) for the duration of the COVID-19 declaration under Section 56 4(b)(1) of the Act, 21 U.S.C. section 360bbb-3(b)(1), unless the authorization is terminated or revoked sooner. Performed at [facility], [place] [address], [place],  [address]      Labs: BNP (last 3 results) No results for input(s): BNP in the last 8760 hours. Basic Metabolic Panel: Recent Labs  Lab 11/14/18 1608 11/15/18 0503 11/16/18 0502  NA 140 140 141  K 3.4* 3.4* 3.8  CL 106 110 112*  CO2 26 24 22   GLUCOSE 96 94 93  BUN 5* 5* 5*  CREATININE 0.59 0.55 0.50  CALCIUM 9.0 8.0* 7.9*   Liver Function Tests: Recent Labs  Lab 11/14/18 1608 11/15/18 0503  AST 15 12*  ALT 15 12  ALKPHOS 52 42  BILITOT 0.5 0.4  PROT 7.1 5.9*  ALBUMIN 4.0 3.2*   Recent Labs  Lab 11/14/18 1608  LIPASE 18   No results for input(s): AMMONIA in the last 168 hours. CBC: Recent Labs  Lab 11/14/18 1608 11/15/18 0503 11/16/18 0502  WBC 9.0 7.3 7.5  NEUTROABS 6.4  --  4.4  HGB 12.6 12.2 12.0  HCT 37.9 38.7 37.7  MCV 93.6 97.7 97.2  PLT 267 230 234   Cardiac Enzymes: No results for input(s): CKTOTAL, CKMB, CKMBINDEX, TROPONINI in the last 168 hours. BNP: Invalid input(s): POCBNP CBG: No results for input(s): GLUCAP in the last 168 hours. D-Dimer No results for input(s): DDIMER in the last 72 hours. Hgb A1c No results for input(s): HGBA1C in the last 72 hours. Lipid Profile No results for input(s): CHOL, HDL, LDLCALC, TRIG, CHOLHDL, LDLDIRECT in the last 72 hours. Thyroid function studies No results for input(s): TSH, T4TOTAL, T3FREE, THYROIDAB in the last 72 hours.  Invalid input(s): FREET3 Anemia  work up No results for input(s): VITAMINB12, FOLATE, FERRITIN, TIBC, IRON, RETICCTPCT in the last 72 hours. Urinalysis    Component Value Date/Time   COLORURINE STRAW (A) 11/14/2018 1945   APPEARANCEUR CLEAR 11/14/2018 1945   LABSPEC 1.006 11/14/2018 1945   PHURINE 8.0 11/14/2018 1945   GLUCOSEU NEGATIVE 11/14/2018 1945   HGBUR NEGATIVE 11/14/2018 1945   [place] NEGATIVE 11/14/2018 1945   KETONESUR 5 (A) 11/14/2018 1945   PROTEINUR NEGATIVE 11/14/2018 1945   NITRITE NEGATIVE 11/14/2018 1945   LEUKOCYTESUR NEGATIVE 11/14/2018 1945   Sepsis Labs Invalid input(s): PROCALCITONIN,  WBC,  LACTICIDVEN Microbiology Recent Results (from the past 240 hour(s))  SARS CORONAVIRUS 2 Nasal Swab Aptima Multi Swab     Status: None   Collection Time: 11/14/18  7:14 PM   Specimen: Aptima Multi Swab; Nasal Swab  Result Value Ref Range Status  SARS Coronavirus 2 NEGATIVE NEGATIVE Final    Comment: (NOTE) SARS-CoV-2 target nucleic acids are NOT DETECTED. The SARS-CoV-2 RNA is generally detectable in upper and lower respiratory specimens during the acute phase of infection. Negative results do not preclude SARS-CoV-2 infection, do not rule out co-infections with other pathogens, and should not be used as the sole basis for treatment or other patient management decisions. Negative results must be combined with clinical observations, patient history, and epidemiological information. The expected result is Negative. Fact Sheet for Patients: SugarRoll.be Fact Sheet for Healthcare Providers: https://www.woods-mathews.com/ This test is not yet approved or cleared by the [place] FDA and  has been authorized for detection and/or diagnosis of SARS-CoV-2 by FDA under an Emergency Use Authorization (EUA). This EUA will remain  in effect (meaning this test can be used) for the duration of the COVID-19 declaration under Section 56 4(b)(1) of the Act, 21  U.S.C. section 360bbb-3(b)(1), unless the authorization is terminated or revoked sooner. Performed at [facility], [place] [address], [place], [place] [address]      Time coordinating discharge: Over 30 minutes  SIGNED:   Nicolette Bang, MD  [facility] 11/16/2018, 11:28 AM Pager   If 7PM-7AM, please contact night-coverage [email or web address] Password [email or web address]

## 2018-11-16 NOTE — Progress Notes (Signed)
Daughter making excuses while patient is not going home tonight. Told patient that nurse said she did not want to go home, which is not true. Patient is ready to go. Angry and also sorry  that she is not able to go home tonight and said she feel homeless. Daughter also said that nurse said patient can stay tonight, which nurse clarify with her that is not  in my power to decide if patient go home or not. That I have to follow hospital policy.

## 2018-11-16 NOTE — Progress Notes (Signed)
Daughter insist that patient is not going back home tonight after she has been discharged due to family circumstances.. Voucher options given for a taxi to take patient home but daughter refuses. Michela Pitcher She will pick her up tomorrow around  1730 to 1800. NP and [facility] aware.

## 2018-11-17 ENCOUNTER — Encounter: Payer: Self-pay | Admitting: *Deleted

## 2018-11-17 NOTE — Care Management Important Message (Signed)
Important Message  Patient Details IM Letter given to Dessa Phi RN to present to the Patient Name: Jessica Larson MRN: [id number] Date of Birth: 07/11/1955   Medicare Important Message Given:  Yes     [place], [place] 11/17/2018, 11:26 AM

## 2018-11-17 NOTE — TOC Initial Note (Signed)
Transition of Care [facility]) - Initial/Assessment Note    Patient Details  Name: Jessica Larson MRN: [id number] Date of Birth: 1955-04-25  [facility] CM/SW Contact:    Dessa Phi, RN Phone Number: 11/17/2018, 11:14 AM  Clinical Narrative: Spoke to patient about d/c plans-she says she is not threatened,& feels safe in her home.She lives there w/dtr. She has some home issues w/dtr but she is followed by DSS-they are helpful. Patient states she does not have her money or credit card here in hospital, & dtr unable to pick her up. Taxi voucher approved by adminstration. Taxi voucher in shadow chart for nsg to call when ready. No further CM needs.                  Expected Discharge Plan: Home/Self Care Barriers to Discharge: No Barriers Identified   Patient Goals and CMS Choice Patient states their goals for this hospitalization and ongoing recovery are:: go home      Expected Discharge Plan and Services Expected Discharge Plan: Home/Self Care   Discharge Planning Services: CM Consult     Expected Discharge Date: 11/16/18                                    Prior Living Arrangements/Services   Lives with:: Adult Children Patient language and need for interpreter reviewed:: Yes        Need for Family Participation in Patient Care: No (Comment) Care giver support system in place?: Yes (comment)   Criminal Activity/Legal Involvement Pertinent to Current Situation/Hospitalization: No - Comment as needed  Activities of Daily Living Home Assistive Devices/Equipment: Walker (specify type)(front wheel) ADL Screening (condition at time of admission) Patient's cognitive ability adequate to safely complete daily activities?: Yes Is the patient deaf or have difficulty hearing?: No Does the patient have difficulty seeing, even when wearing glasses/contacts?: No Does the patient have difficulty concentrating, remembering, or making decisions?: No Patient able to  express need for assistance with ADLs?: Yes Does the patient have difficulty dressing or bathing?: Yes(sometimes) Independently performs ADLs?: Yes (appropriate for developmental age)(daughter helps sometime) Does the patient have difficulty walking or climbing stairs?: Yes Weakness of Legs: Both Weakness of Arms/Hands: None  Permission Sought/Granted Permission sought to share information with : Case Manager Permission granted to share information with : Yes, Verbal Permission Granted              Emotional Assessment Appearance:: Appears stated age Attitude/Demeanor/Rapport: Gracious Affect (typically observed): Accepting Orientation: : Oriented to Self, Oriented to Place, Oriented to  Time, Oriented to Situation Alcohol / Substance Use: Never Used Psych Involvement: No (comment)  Admission diagnosis:  Abdominal pain, unspecified abdominal location [R10.9] Intractable vomiting, presence of nausea not specified, unspecified vomiting type [R11.10] Patient Active Problem List   Diagnosis Date Noted  . Nausea and vomiting 11/15/2018  . Nausea & vomiting 11/14/2018  . Chronic back pain 11/14/2018  . Essential hypertension 11/14/2018  . Pulmonary nodule 11/14/2018   PCP:  System, Pcp Not In Pharmacy:   [id number] - [place], [place] [address] Phone: [phone] Fax: [phone]     Social Determinants of Health (SDOH) Interventions    Readmission Risk Interventions No flowsheet data found.

## 2018-11-20 ENCOUNTER — Telehealth: Payer: Self-pay | Admitting: *Deleted

## 2018-11-20 ENCOUNTER — Encounter

## 2018-11-20 ENCOUNTER — Ambulatory Visit: Payer: Medicare HMO | Admitting: Neurology

## 2018-11-20 NOTE — Telephone Encounter (Signed)
No showed new patient appointment. 

## 2018-11-21 ENCOUNTER — Encounter: Payer: Self-pay | Admitting: Neurology

## 2018-12-08 ENCOUNTER — Emergency Department
Admission: EM | Admit: 2018-12-08 | Discharge: 2018-12-08 | Disposition: A | Discharge status: Home / Self Care | Payer: Medicare HMO | Attending: Emergency Medicine | Admitting: Emergency Medicine

## 2018-12-08 ENCOUNTER — Emergency Department: Payer: Medicare HMO

## 2018-12-08 ENCOUNTER — Other Ambulatory Visit: Payer: Self-pay

## 2018-12-08 DIAGNOSIS — Z79899 Other long term (current) drug therapy: Secondary | ICD-10-CM | POA: Insufficient documentation

## 2018-12-08 DIAGNOSIS — T07XXXA Unspecified multiple injuries, initial encounter: Secondary | ICD-10-CM

## 2018-12-08 DIAGNOSIS — W108XXA Fall (on) (from) other stairs and steps, initial encounter: Secondary | ICD-10-CM

## 2018-12-08 DIAGNOSIS — Y9301 Activity, walking, marching and hiking: Secondary | ICD-10-CM | POA: Diagnosis not present

## 2018-12-08 DIAGNOSIS — Y92009 Unspecified place in unspecified non-institutional (private) residence as the place of occurrence of the external cause: Secondary | ICD-10-CM | POA: Insufficient documentation

## 2018-12-08 DIAGNOSIS — Y999 Unspecified external cause status: Secondary | ICD-10-CM | POA: Diagnosis not present

## 2018-12-08 DIAGNOSIS — S80811A Abrasion, right lower leg, initial encounter: Secondary | ICD-10-CM | POA: Diagnosis not present

## 2018-12-08 DIAGNOSIS — S81819A Laceration without foreign body, unspecified lower leg, initial encounter: Secondary | ICD-10-CM

## 2018-12-08 DIAGNOSIS — R51 Headache: Secondary | ICD-10-CM | POA: Insufficient documentation

## 2018-12-08 DIAGNOSIS — W109XXA Fall (on) (from) unspecified stairs and steps, initial encounter: Secondary | ICD-10-CM | POA: Diagnosis not present

## 2018-12-08 DIAGNOSIS — F0390 Unspecified dementia without behavioral disturbance: Secondary | ICD-10-CM | POA: Insufficient documentation

## 2018-12-08 DIAGNOSIS — Z87891 Personal history of nicotine dependence: Secondary | ICD-10-CM | POA: Diagnosis not present

## 2018-12-08 DIAGNOSIS — S81812A Laceration without foreign body, left lower leg, initial encounter: Secondary | ICD-10-CM | POA: Diagnosis not present

## 2018-12-08 LAB — BASIC METABOLIC PANEL
Anion gap: 9 (ref 5–15)
BUN: 12 mg/dL (ref 8–23)
CO2: 28 mmol/L (ref 22–32)
Calcium: 8.9 mg/dL (ref 8.9–10.3)
Chloride: 101 mmol/L (ref 98–111)
Creatinine, Ser: 0.77 mg/dL (ref 0.44–1.00)
GFR calc Af Amer: >60 mL/min (ref >60)
GFR calc non Af Amer: >60 mL/min (ref >60)
Glucose, Bld: 100 mg/dL — ABNORMAL HIGH (ref 70–99)
Potassium: 3.8 mmol/L (ref 3.5–5.1)
Sodium: 138 mmol/L (ref 135–145)

## 2018-12-08 LAB — CBC
HCT: 37.9 % (ref 36.0–46.0)
Hemoglobin: 12.4 g/dL (ref 12.0–15.0)
MCH: 30.5 pg (ref 26.0–34.0)
MCHC: 32.7 g/dL (ref 30.0–36.0)
MCV: 93.3 fL (ref 80.0–100.0)
Platelets: 234 10*3/uL (ref 150–400)
RBC: 4.06 MIL/uL (ref 3.87–5.11)
RDW: 13.4 % (ref 11.5–15.5)
WBC: 7.6 10*3/uL (ref 4.0–10.5)
nRBC: 0 % (ref 0.0–0.2)

## 2018-12-08 MED ORDER — LIDOCAINE HCL (PF) 1 % IJ SOLN
INTRAMUSCULAR | Status: AC
  Filled 2018-12-08: qty 5

## 2018-12-08 MED ORDER — HYDROCODONE-ACETAMINOPHEN 5-325 MG PO TABS: 1.0000 | ORAL_TABLET | Freq: Four times a day (QID) | ORAL | 0 refills | Status: DC | PRN

## 2018-12-08 MED ORDER — HYDROCODONE-ACETAMINOPHEN 5-325 MG PO TABS
2.0000 | ORAL_TABLET | Freq: Once | ORAL | Status: AC
  Administered 2018-12-08: 2 via ORAL
  Filled 2018-12-08: qty 2

## 2018-12-08 MED ORDER — LIDOCAINE HCL (PF) 1 % IJ SOLN
30.0000 mL | Freq: Once | INTRAMUSCULAR | Status: AC
  Administered 2018-12-08: 30 mL via INTRADERMAL

## 2018-12-08 NOTE — ED Notes (Signed)
Pt's bilateral lower extremities wrapped and bandaged up. Pt educated on care for wounds

## 2018-12-08 NOTE — ED Notes (Signed)
Pt up to toilet. Pt ambulatory with steady gait.  

## 2018-12-08 NOTE — ED Notes (Signed)
Esign not working at this time, pt and daughter verbalized discharge instructions and has no questions at this time

## 2018-12-08 NOTE — ED Notes (Signed)
Pt up to toilet and ambulatory with steady gait. MD informed. Pt states she is ready to go home. Daughter called and states she lives 2 minutes away and will come once pt is up for discharge

## 2018-12-08 NOTE — ED Provider Notes (Signed)
[facility] Emergency Department Provider Note   ____________________________________________   First MD Initiated Contact with Patient 12/08/18 1852     (approximate)  I have reviewed the triage vital signs and the nursing notes.   HISTORY  Chief Complaint Fall  EM caveat: Some dementia limits history but for the most part the patient has good recollection of today's event  HPI Jessica Larson is a 63 y.o. female   reports she fell down the top of her stairs today.  She was in her normal health, she got to the stairs and reports she tripped falling forward falling down a flight of stairs.  She reports she bumped down most the way but when she got to the bottom she rolled somewhat.  She bumped her head but did not injure it without headache or pain.  No loss consciousness.  No chest pain no trouble breathing.  She complains of discomfort in her lower back, has chronic back pain but also reports there a couple cuts over the lower back, she also cut both of her lower legs somehow.  She reports pain in an area of the mid left thigh, also some over over the left calf.  Denies pain in the right leg.  No injury to the arms.  No numbness tingling or weakness.  Reports she normally takes hydrocodone 10 mg at home for pain, and presently does not have any and because she is switching doctors.  No new numbness or tingling.  No new muscle weakness.  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[place])    Anxiety    Atypical squamous cells of undetermined significance (ASCUS) on Papanicolaou smear of cervix    Cancer (HCC)    Chronic back pain    Chronic prescription benzodiazepine use    Colon cancer (HCC)    High cholesterol    History of loop recorder    History of shingles    Hypertension    Osteoporosis    Tremor     Patient Active Problem List   Diagnosis Date Noted   Nausea and vomiting 11/15/2018   Nausea &  vomiting 11/14/2018   Chronic back pain 11/14/2018   Essential hypertension 11/14/2018   Pulmonary nodule 11/14/2018    Past Surgical History:  Procedure Laterality Date   COLON SURGERY      Prior to Admission medications   Medication Sig Start Date End Date Taking? Authorizing Provider  alprazolam Duanne Moron) 2 MG tablet Take 2 mg by mouth 4 (four) times daily.    [provider]  amitriptyline (ELAVIL) 50 MG tablet Take 50 mg by mouth at bedtime.    [provider]  atenolol (TENORMIN) 50 MG tablet Take 50 mg by mouth 2 (two) times daily.    [provider]  Cholecalciferol (VITAMIN D3) 5000 units TBDP Take 1 tablet by mouth daily.     [provider]  cyclobenzaprine (FLEXERIL) 10 MG tablet Take 10 mg by mouth daily as needed for muscle spasms.  11/04/18   [provider]  fluticasone (FLONASE) 50 MCG/ACT nasal spray Place 1 spray into both nostrils daily as needed for allergies.  06/20/18   [provider]  gabapentin (NEURONTIN) 800 MG tablet Take 800 mg by mouth 3 (three) times daily.     [provider]  HYDROcodone-acetaminophen (NORCO/VICODIN) 5-325 MG tablet Take 1 tablet by mouth every 6 (six) hours as needed for moderate pain or  severe pain.  11/10/18   [provider]  pantoprazole (PROTONIX) 40 MG tablet Take 40 mg by mouth 2 (two) times daily as needed (indigestion).  09/02/18   [provider]  promethazine (PHENERGAN) 25 MG tablet Take 25 mg by mouth every 8 (eight) hours as needed for nausea or vomiting.  10/16/18   [provider]    Allergies Patient has no known allergies.  No family history on file.  Social History Social History   Tobacco Use   Smoking status: Former Smoker    Years: 0.00    Quit date: 09/15/2012    Years since quitting: 6.2   Smokeless tobacco: Never Used  Substance Use Topics   Alcohol use: Not Currently    Frequency: Never   Drug use: Never     Review of Systems Constitutional: No fever/chills or recent illness Eyes: No visual changes. ENT: No sore throat. Cardiovascular: Denies chest pain. Respiratory: Denies shortness of breath. Gastrointestinal: No abdominal pain.   Musculoskeletal: No neck or upper back pain, some pain in her low back.  Also reports pain over the left mid thigh. Skin: Negative for rash. Neurological: Negative for headaches, areas of focal weakness or numbness.    ____________________________________________   PHYSICAL EXAM:  VITAL SIGNS: ED Triage Vitals  Enc Vitals Group     BP 12/08/18 1809 111/62     Pulse Rate 12/08/18 1809 73     Resp 12/08/18 1809 (!) 24     Temp 12/08/18 1809 99.6 F (37.6 C)     Temp Source 12/08/18 1809 Oral     SpO2 12/08/18 1809 94 %     Weight 12/08/18 1811 150 lb (68 kg)     Height 12/08/18 1811 5\' 4"  (1.626 m)     Head Circumference --      Peak Flow --      Pain Score 12/08/18 1810 8     Pain Loc --      Pain Edu? --      Excl. in [place]? --     Constitutional: Alert and oriented. Well appearing and in no acute distress.  She is pleasant.  Well oriented to her self, being at the hospital and also recalls situation.  Disoriented to month Eyes: Conjunctivae are normal. Head: Atraumatic. Nose: No congestion/rhinnorhea.  C-collar in place.  No midline cervical or thoracic tenderness. Mouth/Throat: Mucous membranes are moist. Neck: No stridor.  Cardiovascular: Normal rate, regular rhythm. Grossly normal heart sounds.  Good peripheral circulation. Respiratory: Normal respiratory effort.  No retractions. Lungs CTAB. Gastrointestinal: Soft and nontender. No distention. Musculoskeletal: No lower extremity tenderness nor edema.  Logrolled.  She identifies pain primarily over the paralumbar regions where there are a couple very superficial excoriations without bleeding best described as linear abrasions.  These areas are tender.  There is no frank midline lumbar  thoracic or cervical tenderness.  Normal sensation in the perirectal region.  Logrolled with nurse Warner Mccreedy.  RIGHT Right upper extremity demonstrates normal strength, good use of all muscles. No edema bruising or contusions of the right shoulder/upper arm, right elbow, right forearm / hand. Full range of motion of the right right upper extremity without pain. No evidence of trauma. Strong radial pulse. Intact median/ulnar/radial neuro-muscular exam.  LEFT Left upper extremity demonstrates normal strength, good use of all muscles. No edema bruising or contusions of the left shoulder/upper arm, left elbow, left forearm / hand. Full range of motion of the left  upper extremity without  pain. No evidence of trauma. Strong radial pulse. Intact median/ulnar/radial neuro-muscular exam.  Lower Extremities  No edema. Normal DP/PT pulses bilateral with good cap refill.  Normal neuro-motor function lower extremities bilateral.  RIGHT Right lower extremity demonstrates normal strength, good use of all muscles. No edema bruising or contusions of the right hip, right knee, right ankle. Full range of motion of the right lower extremity without pain. No pain on axial loading. No evidence of trauma except for a approximately 2 cm very superficial abrasion/linear abrasion located vertically over the medial anterior shin.  LEFT Left lower extremity demonstrates normal strength, good use of all muscles but reports some tenderness over the calf where the laceration is present and also over the left mid femur without bruising or abrasion or deformity. No edema bruising or contusions of the hip,  knee, ankle. Full range of motion of the left lower extremity but does report some discomfort. no pain on axial loading. No evidence of trauma.   Neurologic:  Normal speech and language. No gross focal neurologic deficits are appreciated.  Skin:  Skin is warm, dry and intact. No rash noted. Psychiatric: Mood and affect are  normal. Speech and behavior are normal.  ____________________________________________   LABS (all labs ordered are listed, but only abnormal results are displayed)  Labs Reviewed  BASIC METABOLIC PANEL - Abnormal; Notable for the following components:      Result Value   Glucose, Bld 100 (*)    All other components within normal limits  CBC   ____________________________________________  EKG  Reviewed entered by me at 1810 Heart rate 75 QRS 99 QTc 450 Normal sinus rhythm without evidence of ischemia or ectopy. ____________________________________________  RADIOLOGY  Dg Chest 2 View  Result Date: 12/08/2018 CLINICAL DATA:  Fall EXAM: CHEST - 2 VIEW COMPARISON:  None. FINDINGS: Streaky atelectatic changes. No consolidation, features of edema, pneumothorax, or effusion. Pulmonary vascularity is normally distributed. The cardiomediastinal contours are unremarkable. No acute osseous or soft tissue abnormality. Implantable loop recorder projects over the left chest wall. IMPRESSION: Atelectasis, otherwise no acute cardiopulmonary abnormality. No visible displaced rib fracture or other acute osseous injury. Electronically Signed   By: Lovena Le M.D.   On: 12/08/2018 19:51   Dg Lumbar Spine 2-3 Views  Result Date: 12/08/2018 CLINICAL DATA:  Fall EXAM: LUMBAR SPINE - 2-3 VIEW COMPARISON:  None. FINDINGS: Rudimentary ribs are noted at the T12 level. Five non-rib-bearing lumbar type vertebral bodies are noted. Mild levoconvex curvature of the lumbar spine centered at L3. multilevel discogenic and facet degenerative changes are present most pronounced in the lower lumbar levels. No visible fracture, vertebral body height loss or traumatic listhesis. Atherosclerotic calcification is noted in the aorta. Lung bases are clear. Bowel gas pattern is nonobstructive. Vascular calcifications are noted in the region of the spleen. IMPRESSION: No acute fracture or traumatic listhesis. Levoconvex  curvature of the spine. Multilevel degenerative changes most pronounced in the lower lumbar levels. Please note: Spine radiography has limited sensitivity and specificity in the setting of significant trauma. If there is significant mechanism, recommend low threshold for CT imaging. Electronically Signed   By: Lovena Le M.D.   On: 12/08/2018 19:46   Dg Pelvis 1-2 Views  Addendum Date: 12/08/2018   ADDENDUM REPORT: 12/08/2018 19:47 ADDENDUM: Clinical data should indicate: Fall, no loss of consciousness. Electronically Signed   By: Lovena Le M.D.   On: 12/08/2018 19:47   Result Date: 12/08/2018 CLINICAL DATA:  Fall, loss of consciousness EXAM:  PELVIS - 1-2 VIEW COMPARISON:  None. FINDINGS: The bones of the pelvis are congruent, without fracture or diastatic widening. The femoral heads remain normally located. Degenerative changes are present in the lower lumbar spine. Mild degenerative features are noted in both hips. Phleboliths project over the pelvis. Bowel gas pattern is normal. There is left lateral hip swelling. Remaining soft tissues unremarkable. IMPRESSION: Left lateral hip swelling. No fracture. Degenerative changes in the hips and lower lumbar spine. Electronically Signed: By: Lovena Le M.D. On: 12/08/2018 19:44   Dg Tibia/fibula Left  Addendum Date: 12/08/2018   ADDENDUM REPORT: 12/08/2018 19:49 ADDENDUM: Clinical data should indicate: Fall, no loss of consciousness. Electronically Signed   By: Lovena Le M.D.   On: 12/08/2018 19:49   Result Date: 12/08/2018 CLINICAL DATA:  Fall, loss of consciousness EXAM: LEFT TIBIA AND FIBULA - 2 VIEW COMPARISON:  None. FINDINGS: The tibia and fibula are intact. No acute fracture or traumatic malalignment. Mild tricompartmental degenerative changes are noted at the left knee. Enthesopathic changes are noted at the superior pole of the patella. Trace left knee joint effusion. Ankle alignment is grossly maintained. Soft tissues are unremarkable.  IMPRESSION: Mild tricompartmental degenerative changes of the left knee. No acute osseous abnormality. Electronically Signed: By: Lovena Le M.D. On: 12/08/2018 19:40   Dg Tibia/fibula Right  Addendum Date: 12/08/2018   ADDENDUM REPORT: 12/08/2018 19:50 ADDENDUM: Clinical data should indicate: Fall, no loss of consciousness. Electronically Signed   By: Lovena Le M.D.   On: 12/08/2018 19:50   Result Date: 12/08/2018 CLINICAL DATA:  Fall, loss of consciousness EXAM: RIGHT TIBIA AND FIBULA - 2 VIEW COMPARISON:  None. FINDINGS: The tibia and fibula are intact.Mild tricompartmental degenerative changes of the right knee. Of the sub pathic changes are noted at the superior pole of the patella. Suspect trace knee joint effusion. Alignment at the ankle is grossly maintained though incompletely evaluated on non dedicated radiograph. IMPRESSION: Mild tricompartmental degenerative changes of the right knee. Suspect trace knee joint effusion. Electronically Signed: By: Lovena Le M.D. On: 12/08/2018 19:42   Ct Head Wo Contrast  Result Date: 12/08/2018 CLINICAL DATA:  Trip and fall down stairs. EXAM: CT HEAD WITHOUT CONTRAST CT CERVICAL SPINE WITHOUT CONTRAST TECHNIQUE: Multidetector CT imaging of the head and cervical spine was performed following the standard protocol without intravenous contrast. Multiplanar CT image reconstructions of the cervical spine were also generated. COMPARISON:  None. FINDINGS: CT HEAD FINDINGS Brain: Punctate hyperdensity in the left basal ganglia most consistent with incidental mineralization. No intracranial hemorrhage, mass effect, or midline shift. No hydrocephalus. The basilar cisterns are patent. No evidence of territorial infarct or acute ischemia. No extra-axial or intracranial fluid collection. Vascular: No hyperdense vessel. Skull: No fracture or focal lesion. Sinuses/Orbits: Paranasal sinuses and mastoid air cells are clear. The visualized orbits are unremarkable. Other:  None. CT CERVICAL SPINE FINDINGS Alignment: Straightening of normal lordosis. No traumatic subluxation. Chest anterolisthesis is T1 on T2 and C7 on T1 is likely degenerative. Skull base and vertebrae: No acute fracture. Vertebral body heights are maintained. The dens and skull base are intact. Soft tissues and spinal canal: No prevertebral fluid or swelling. No visible canal hematoma. Disc levels: Disc space narrowing and endplate spurring seen from C4-C5 through C6-C7. Scattered facet hypertrophy. Upper chest: Negative. Other: None. IMPRESSION: 1. No acute intracranial abnormality. No skull fracture. 2. Degenerative change in the cervical spine without acute fracture or subluxation. Electronically Signed   By: Aurther Loft.D.  On: 12/08/2018 19:57   Ct Cervical Spine Wo Contrast  Result Date: 12/08/2018 CLINICAL DATA:  Trip and fall down stairs. EXAM: CT HEAD WITHOUT CONTRAST CT CERVICAL SPINE WITHOUT CONTRAST TECHNIQUE: Multidetector CT imaging of the head and cervical spine was performed following the standard protocol without intravenous contrast. Multiplanar CT image reconstructions of the cervical spine were also generated. COMPARISON:  None. FINDINGS: CT HEAD FINDINGS Brain: Punctate hyperdensity in the left basal ganglia most consistent with incidental mineralization. No intracranial hemorrhage, mass effect, or midline shift. No hydrocephalus. The basilar cisterns are patent. No evidence of territorial infarct or acute ischemia. No extra-axial or intracranial fluid collection. Vascular: No hyperdense vessel. Skull: No fracture or focal lesion. Sinuses/Orbits: Paranasal sinuses and mastoid air cells are clear. The visualized orbits are unremarkable. Other: None. CT CERVICAL SPINE FINDINGS Alignment: Straightening of normal lordosis. No traumatic subluxation. Chest anterolisthesis is T1 on T2 and C7 on T1 is likely degenerative. Skull base and vertebrae: No acute fracture. Vertebral body heights are  maintained. The dens and skull base are intact. Soft tissues and spinal canal: No prevertebral fluid or swelling. No visible canal hematoma. Disc levels: Disc space narrowing and endplate spurring seen from C4-C5 through C6-C7. Scattered facet hypertrophy. Upper chest: Negative. Other: None. IMPRESSION: 1. No acute intracranial abnormality. No skull fracture. 2. Degenerative change in the cervical spine without acute fracture or subluxation. Electronically Signed   By: Keith Rake M.D.   On: 12/08/2018 19:57   Dg Femur Min 2 Views Left  Addendum Date: 12/08/2018   ADDENDUM REPORT: 12/08/2018 19:48 ADDENDUM: Clinical data should indicate: Fall, no loss of consciousness. Electronically Signed   By: Lovena Le M.D.   On: 12/08/2018 19:48   Result Date: 12/08/2018 CLINICAL DATA:  Fall, loss of consciousness EXAM: LEFT FEMUR 2 VIEWS COMPARISON:  None. FINDINGS: The femur is intact. Alignment of the hip and knee is grossly preserved. Trace left knee joint effusion. Tiny ossification adjacent the greater trochanter is likely enthesopathic. Mild lateral hip swelling is noted. IMPRESSION: Mild lateral hip swelling. Trace left knee joint effusion. Electronically Signed: By: Lovena Le M.D. On: 12/08/2018 19:39    Imaging studies reviewed, negative for acute bony injury or significant or major trauma. ____________________________________________   PROCEDURES  Procedure(s) performed: Lacerations, see procedure note(s).  Marland Kitchen.Laceration Repair  Date/Time: 12/08/2018 9:02 PM Performed by: Delman Kitten, MD Authorized by: Delman Kitten, MD   Consent:    Consent obtained:  Verbal   Consent given by:  Patient   Alternatives discussed: Suturing discussed, patient would like to have this glued if at all possible. Anesthesia (see MAR for exact dosages):    Anesthesia method:  None Laceration details:    Location:  Leg   Length (cm):  8   Depth (mm):  3 Repair type:    Repair type:   Simple Exploration:    Contaminated: no   Treatment:    Amount of cleaning:  Standard   Irrigation solution:  Sterile saline   Visualized foreign bodies/material removed: no   Skin repair:    Repair method:  Tissue adhesive Approximation:    Approximation:  Close Post-procedure details:    Dressing:  Open (no dressing)   Patient tolerance of procedure:  Tolerated well, no immediate complications Comments:     Right shin .Marland KitchenLaceration Repair  Date/Time: 12/08/2018 9:02 PM Performed by: Delman Kitten, MD Authorized by: Delman Kitten, MD   Consent:    Consent obtained:  Verbal   Consent given  by:  Patient   Risks discussed:  Infection, pain, retained foreign body, poor cosmetic result and poor wound healing Anesthesia (see MAR for exact dosages):    Anesthesia method:  Local infiltration   Local anesthetic:  Lidocaine 1% w/o epi Laceration details:    Location:  Leg   Leg location:  L lower leg   Length (cm):  10   Depth (mm):  5 Repair type:    Repair type:  Simple Pre-procedure details:    Preparation:  Patient was prepped and draped in usual sterile fashion and imaging obtained to evaluate for foreign bodies Exploration:    Hemostasis achieved with:  Direct pressure   Wound exploration: entire depth of wound probed and visualized     Contaminated: no   Treatment:    Area cleansed with:  Saline   Amount of cleaning:  Extensive   Irrigation solution:  Sterile saline   Visualized foreign bodies/material removed: no   Skin repair:    Repair method:  Sutures   Suture size:  3-0   Suture material:  Nylon   Suture technique:  Simple interrupted   Number of sutures:  7 Approximation:    Approximation:  Close Post-procedure details:    Dressing:  Non-adherent dressing   Patient tolerance of procedure:  Tolerated well, no immediate complications Comments:     Tolerated well.  Utilized 10 mL's 1% lidocaine    Critical Care performed:  No     ____________________________________________   INITIAL IMPRESSION / ASSESSMENT AND PLAN / ED COURSE  Pertinent labs & imaging results that were available during my care of the patient were reviewed by me and considered in my medical decision making (see chart for details).   Patient presents after fall down the stairs.  She does have abrasions over her back, also lacerations lower legs bilateral.  Mechanical in nature, reports he tripped at the top of the stairs.  Reassuring imaging studies.  Some slight tenderness over the left femoral region without deformity.  She is able to walk on it with just a feeling of soreness.  No signs or symptoms suggest hip fracture.  Lacerations repaired with Dermabond and suturing.  Patient reports up-to-date tetanus  No evidence of acute major injury to the head neck chest on imaging.  Denies chest pain denies respiratory symptoms, no abdominal pain.  Reassuring clinical examination.  Imaging lumbar spine does not show acute fracture, patient does report a long history of chronic low back pain as well and reports this is consistent with same.  Lab work reassuring.  ----------------------------------------- 9:05 PM on 12/08/2018 -----------------------------------------  Patient alert, resting comfortably in no distress.  Discussed with the patient, she would like a short course of hydrocodone which she reports she recently ran out of.  I discussed with her need to follow-up closely the primary, careful use of hydrocodone, and will provide her a very short prescription for this..  She is used hydrocodone in the past as well with her current medications.       ----------------------------------------- 10:05 PM on 12/08/2018 -----------------------------------------  I will prescribe the patient a narcotic pain medicine due to their condition which I anticipate will cause at least moderate pain short term. I discussed with the patient safe use of  narcotic pain medicines, and that they are not to drive, work in dangerous areas, or ever take more than prescribed (no more than 1 pill every 6 hours). We discussed that this is the type of medication that can  be  overdosed on and the risks of this type of medicine. Patient is very agreeable to only use as prescribed and to never use more than prescribed.  Prescription sent to [facility] pharmacy in [place] as patient requested.  Daughter picking her up.  She is awake alert no distress.  Able to ambulate to her baseline.  Appears well and appropriate for discharge.  Return precautions and treatment recommendations and follow-up discussed with the patient who is agreeable with the plan.  ____________________________________________   FINAL CLINICAL IMPRESSION(S) / ED DIAGNOSES  Final diagnoses:  Laceration of multiple sites of lower extremity, unspecified laterality, initial encounter  Abrasions of multiple sites  Fall (on) (from) other stairs and steps, initial encounter        Note:  This document was prepared using Systems analyst and may include unintentional dictation errors       Delman Kitten, MD 12/08/18 2205

## 2018-12-08 NOTE — ED Triage Notes (Signed)
Pt arriving via [facility] from home. EMS states daughter witnessed trip and fall down the stairs. No LOC after the fall. PT arrives with C-collar. Lacerations present on both lower legs and on back above sacral area. Pt complains of R arm pain.  Hx of dementia. Pt is oriented to person and sometimes time. This is baseline per EMS. Hx of anxiety, chronic back pain, hypertension. Not on blood thinners.  BP 118/67 HR 72 RR 17 BS 108 O2 97% RA T 98.7 axillary

## 2018-12-08 NOTE — ED Notes (Signed)
Lacerations to both lower legs. Bandages are in place, bleeding controlled at this time. Bruise noted on top of L foot with swelling.

## 2018-12-18 ENCOUNTER — Emergency Department: Payer: Medicare HMO

## 2018-12-18 ENCOUNTER — Other Ambulatory Visit: Payer: Self-pay

## 2018-12-18 ENCOUNTER — Encounter: Payer: Self-pay | Admitting: Emergency Medicine

## 2018-12-18 ENCOUNTER — Emergency Department
Admission: EM | Admit: 2018-12-18 | Discharge: 2018-12-18 | Disposition: A | Discharge status: Home / Self Care | Payer: Medicare HMO | Attending: Student | Admitting: Student

## 2018-12-18 DIAGNOSIS — R0602 Shortness of breath: Secondary | ICD-10-CM | POA: Insufficient documentation

## 2018-12-18 DIAGNOSIS — R112 Nausea with vomiting, unspecified: Secondary | ICD-10-CM | POA: Insufficient documentation

## 2018-12-18 DIAGNOSIS — F419 Anxiety disorder, unspecified: Secondary | ICD-10-CM | POA: Diagnosis not present

## 2018-12-18 DIAGNOSIS — Z87891 Personal history of nicotine dependence: Secondary | ICD-10-CM | POA: Diagnosis not present

## 2018-12-18 DIAGNOSIS — Z79899 Other long term (current) drug therapy: Secondary | ICD-10-CM | POA: Diagnosis not present

## 2018-12-18 DIAGNOSIS — I10 Essential (primary) hypertension: Secondary | ICD-10-CM | POA: Insufficient documentation

## 2018-12-18 DIAGNOSIS — Z20828 Contact with and (suspected) exposure to other viral communicable diseases: Secondary | ICD-10-CM | POA: Diagnosis not present

## 2018-12-18 DIAGNOSIS — R509 Fever, unspecified: Secondary | ICD-10-CM | POA: Diagnosis present

## 2018-12-18 LAB — CBC WITH DIFFERENTIAL/PLATELET
Abs Immature Granulocytes: 0.03 10*3/uL (ref 0.00–0.07)
Basophils Absolute: 0.1 10*3/uL (ref 0.0–0.1)
Basophils Relative: 1 %
Eosinophils Absolute: 0 10*3/uL (ref 0.0–0.5)
Eosinophils Relative: 0 %
HCT: 39.4 % (ref 36.0–46.0)
Hemoglobin: 13 g/dL (ref 12.0–15.0)
Immature Granulocytes: 0 %
Lymphocytes Relative: 33 %
Lymphs Abs: 3.2 10*3/uL (ref 0.7–4.0)
MCH: 30.4 pg (ref 26.0–34.0)
MCHC: 33 g/dL (ref 30.0–36.0)
MCV: 92.3 fL (ref 80.0–100.0)
Monocytes Absolute: 0.4 10*3/uL (ref 0.1–1.0)
Monocytes Relative: 5 %
Neutro Abs: 5.8 10*3/uL (ref 1.7–7.7)
Neutrophils Relative %: 61 %
Platelets: 270 10*3/uL (ref 150–400)
RBC: 4.27 MIL/uL (ref 3.87–5.11)
RDW: 13.7 % (ref 11.5–15.5)
WBC: 9.5 10*3/uL (ref 4.0–10.5)
nRBC: 0 % (ref 0.0–0.2)

## 2018-12-18 LAB — URINALYSIS, COMPLETE (UACMP) WITH MICROSCOPIC
Bilirubin Urine: NEGATIVE
Glucose, UA: NEGATIVE mg/dL
Hgb urine dipstick: NEGATIVE
Ketones, ur: NEGATIVE mg/dL
Nitrite: NEGATIVE
Protein, ur: 30 mg/dL — AB
Specific Gravity, Urine: 1.025 (ref 1.005–1.030)
pH: 6 (ref 5.0–8.0)

## 2018-12-18 LAB — COMPREHENSIVE METABOLIC PANEL
ALT: 16 U/L (ref 0–44)
AST: 25 U/L (ref 15–41)
Albumin: 4.1 g/dL (ref 3.5–5.0)
Alkaline Phosphatase: 49 U/L (ref 38–126)
Anion gap: 11 (ref 5–15)
BUN: 9 mg/dL (ref 8–23)
CO2: 28 mmol/L (ref 22–32)
Calcium: 9.1 mg/dL (ref 8.9–10.3)
Chloride: 104 mmol/L (ref 98–111)
Creatinine, Ser: 0.65 mg/dL (ref 0.44–1.00)
GFR calc Af Amer: >60 mL/min (ref >60)
GFR calc non Af Amer: >60 mL/min (ref >60)
Glucose, Bld: 92 mg/dL (ref 70–99)
Potassium: 4.2 mmol/L (ref 3.5–5.1)
Sodium: 143 mmol/L (ref 135–145)
Total Bilirubin: 0.6 mg/dL (ref 0.3–1.2)
Total Protein: 6.7 g/dL (ref 6.5–8.1)

## 2018-12-18 LAB — LIPASE, BLOOD: Lipase: 16 U/L (ref 11–51)

## 2018-12-18 MED ORDER — ONDANSETRON HCL 4 MG/2ML IJ SOLN
4.0000 mg | Freq: Once | INTRAMUSCULAR | Status: AC
  Administered 2018-12-18: 14:00:00 4 mg via INTRAVENOUS
  Filled 2018-12-18: qty 2

## 2018-12-18 MED ORDER — SODIUM CHLORIDE 0.9 % IV BOLUS
1000.0000 mL | Freq: Once | INTRAVENOUS | Status: AC
  Administered 2018-12-18: 14:00:00 1000 mL via INTRAVENOUS

## 2018-12-18 MED ORDER — ALPRAZOLAM 2 MG PO TABS: 2.0000 mg | ORAL_TABLET | Freq: Three times a day (TID) | ORAL | 0 refills | Status: DC | PRN

## 2018-12-18 MED ORDER — ALPRAZOLAM 2 MG PO TABS: 2.0000 mg | ORAL_TABLET | Freq: Four times a day (QID) | ORAL | 0 refills | Status: DC | PRN

## 2018-12-18 MED ORDER — LORAZEPAM 2 MG/ML IJ SOLN
2.0000 mg | Freq: Once | INTRAMUSCULAR | Status: AC
  Administered 2018-12-18: 2 mg via INTRAVENOUS
  Filled 2018-12-18: qty 1

## 2018-12-18 NOTE — ED Notes (Signed)
Pt sleeping quietly

## 2018-12-18 NOTE — ED Notes (Signed)
Pt ambulatory to the bathroom to collect urine specimen.

## 2018-12-18 NOTE — ED Provider Notes (Signed)
-----------------------------------------   3:03 PM on 12/18/2018 -----------------------------------------  Blood pressure 111/67, pulse 70, temperature 100.1 F (37.8 C), temperature source Oral, resp. rate 16, height 5\' 4"  (1.626 m), weight 65.8 kg, SpO2 99 %.  Assuming care from Dr. Joan Mayans.  In short, Jessica Larson is a 63 y.o. female with a chief complaint of Withdrawal and Fever .  Refer to the original H&P for additional details.  The current plan of care is to follow-up labs, patient likely with combination of benzo and opiate w/d. If labs unremarkable, patient appropriate for d/c home with 3 day script for her usual benzos, will otherwise need to follow-up with her psychiatrist.  Chest x-ray negative for acute process, UA with some WBCs and trace leuks, however given with lack of urinary symptoms, doubt UTI.  Remainder of labs unremarkable.  Patient remains calm and comfortable without any signs of acute withdrawal.  Counseled to follow-up with her psychiatrist for refill of anxiety medications, patient agrees with plan.    Blake Divine, MD 12/18/18 2008

## 2018-12-18 NOTE — ED Triage Notes (Signed)
Pt ems from home for possible xanax withdrawal. Per pt she takes 2 mg xanax qid x 10-15 years. pts last xanax was 2mg  x 1 yesterday. Pt also found to have temp 100.1. pt states that she has not felt good for 4 days.

## 2018-12-18 NOTE — ED Notes (Signed)
Daughter telephone number [phone]

## 2018-12-18 NOTE — Discharge Instructions (Addendum)
Thank you for letting us take care of you in the emergency department today.   Please continue to take any regular, prescribed medications.   We have prescribed you a 3 day course of your anxiety medicine, but after this you need to contact your psychiatrist.  Please follow up with: - Dr. Noland Fordyce   Please return to the ER for any new or worsening symptoms.
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
## 2018-12-19 ENCOUNTER — Emergency Department
Admission: EM | Admit: 2018-12-19 | Discharge: 2018-12-20 | Disposition: A | Discharge status: Other Acute Inpatient Hospital | Payer: Medicare HMO | Attending: Emergency Medicine | Admitting: Emergency Medicine

## 2018-12-19 ENCOUNTER — Other Ambulatory Visit: Payer: Self-pay

## 2018-12-19 ENCOUNTER — Encounter: Payer: Self-pay | Admitting: Emergency Medicine

## 2018-12-19 DIAGNOSIS — F1310 Sedative, hypnotic or anxiolytic abuse, uncomplicated: Secondary | ICD-10-CM | POA: Diagnosis not present

## 2018-12-19 DIAGNOSIS — I10 Essential (primary) hypertension: Secondary | ICD-10-CM | POA: Diagnosis not present

## 2018-12-19 DIAGNOSIS — R911 Solitary pulmonary nodule: Secondary | ICD-10-CM | POA: Diagnosis present

## 2018-12-19 DIAGNOSIS — G8929 Other chronic pain: Secondary | ICD-10-CM | POA: Diagnosis present

## 2018-12-19 DIAGNOSIS — Z79899 Other long term (current) drug therapy: Secondary | ICD-10-CM | POA: Diagnosis not present

## 2018-12-19 DIAGNOSIS — Z87891 Personal history of nicotine dependence: Secondary | ICD-10-CM | POA: Insufficient documentation

## 2018-12-19 DIAGNOSIS — Z85038 Personal history of other malignant neoplasm of large intestine: Secondary | ICD-10-CM | POA: Insufficient documentation

## 2018-12-19 DIAGNOSIS — F1910 Other psychoactive substance abuse, uncomplicated: Secondary | ICD-10-CM | POA: Diagnosis present

## 2018-12-19 DIAGNOSIS — F332 Major depressive disorder, recurrent severe without psychotic features: Secondary | ICD-10-CM | POA: Diagnosis present

## 2018-12-19 DIAGNOSIS — F419 Anxiety disorder, unspecified: Secondary | ICD-10-CM | POA: Diagnosis not present

## 2018-12-19 DIAGNOSIS — R45851 Suicidal ideations: Secondary | ICD-10-CM | POA: Diagnosis not present

## 2018-12-19 DIAGNOSIS — Z046 Encounter for general psychiatric examination, requested by authority: Secondary | ICD-10-CM | POA: Diagnosis not present

## 2018-12-19 DIAGNOSIS — M549 Dorsalgia, unspecified: Secondary | ICD-10-CM | POA: Diagnosis present

## 2018-12-19 DIAGNOSIS — R112 Nausea with vomiting, unspecified: Secondary | ICD-10-CM | POA: Diagnosis present

## 2018-12-19 DIAGNOSIS — Z20828 Contact with and (suspected) exposure to other viral communicable diseases: Secondary | ICD-10-CM | POA: Insufficient documentation

## 2018-12-19 DIAGNOSIS — F329 Major depressive disorder, single episode, unspecified: Secondary | ICD-10-CM | POA: Diagnosis not present

## 2018-12-19 LAB — COMPREHENSIVE METABOLIC PANEL
ALT: 16 U/L (ref 0–44)
AST: 19 U/L (ref 15–41)
Albumin: 4.5 g/dL (ref 3.5–5.0)
Alkaline Phosphatase: 48 U/L (ref 38–126)
Anion gap: 13 (ref 5–15)
BUN: 11 mg/dL (ref 8–23)
CO2: 27 mmol/L (ref 22–32)
Calcium: 9.4 mg/dL (ref 8.9–10.3)
Chloride: 103 mmol/L (ref 98–111)
Creatinine, Ser: 0.63 mg/dL (ref 0.44–1.00)
GFR calc Af Amer: >60 mL/min (ref >60)
GFR calc non Af Amer: >60 mL/min (ref >60)
Glucose, Bld: 104 mg/dL — ABNORMAL HIGH (ref 70–99)
Potassium: 4.6 mmol/L (ref 3.5–5.1)
Sodium: 143 mmol/L (ref 135–145)
Total Bilirubin: 0.6 mg/dL (ref 0.3–1.2)
Total Protein: 7.1 g/dL (ref 6.5–8.1)

## 2018-12-19 LAB — CBC
HCT: 39.8 % (ref 36.0–46.0)
Hemoglobin: 12.9 g/dL (ref 12.0–15.0)
MCH: 30.4 pg (ref 26.0–34.0)
MCHC: 32.4 g/dL (ref 30.0–36.0)
MCV: 93.6 fL (ref 80.0–100.0)
Platelets: 268 10*3/uL (ref 150–400)
RBC: 4.25 MIL/uL (ref 3.87–5.11)
RDW: 13.7 % (ref 11.5–15.5)
WBC: 9.3 10*3/uL (ref 4.0–10.5)
nRBC: 0 % (ref 0.0–0.2)

## 2018-12-19 LAB — SARS CORONAVIRUS 2 (TAT 6-24 HRS): SARS Coronavirus 2: NEGATIVE

## 2018-12-19 LAB — SALICYLATE LEVEL: Salicylate Lvl: <7 mg/dL (ref 2.8–30.0)

## 2018-12-19 LAB — ETHANOL: Alcohol, Ethyl (B): <10 mg/dL (ref <10)

## 2018-12-19 LAB — ACETAMINOPHEN LEVEL: Acetaminophen (Tylenol), Serum: <10 ug/mL — ABNORMAL LOW (ref 10–30)

## 2018-12-19 NOTE — ED Notes (Signed)
With this nurse and EDT Lattie Haw present, pt removes blue tennis shoes, grey socks, grey sweatpants, grey sports bra, pink panties, grey t-shirt, hair tie, --all placed in labeled pt belonging bag to be secured on nursing unit and pt changed in burgandy paper scrubs

## 2018-12-19 NOTE — ED Notes (Signed)
Pt. Transferred from Triage to room after dressing out and screening for contraband. Report to include Situation, Background, Assessment and Recommendations from [facility]. Pt. Oriented to Quad including Q15 minute rounds as well as Engineer, drilling for their protection. Patient is alert and oriented, warm and dry in no acute distress. Patient denies SI, HI, and AVH. Pt. Encouraged to let me know if needs arise.

## 2018-12-19 NOTE — ED Notes (Signed)
Pt given sandwich tray, ginger ale, and two blankets per request.
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
## 2018-12-20 ENCOUNTER — Inpatient Hospital Stay
Admission: RE | Admit: 2018-12-20 | Discharge: 2018-12-29 | DRG: 882 | Disposition: A | Discharge status: Home / Self Care | Payer: Medicare HMO | Attending: Psychiatry | Admitting: Psychiatry

## 2018-12-20 ENCOUNTER — Encounter: Payer: Self-pay | Admitting: Behavioral Health

## 2018-12-20 ENCOUNTER — Ambulatory Visit: Payer: Medicare HMO | Admitting: Adult Health

## 2018-12-20 ENCOUNTER — Other Ambulatory Visit: Payer: Self-pay

## 2018-12-20 DIAGNOSIS — G4700 Insomnia, unspecified: Secondary | ICD-10-CM | POA: Diagnosis present

## 2018-12-20 DIAGNOSIS — Z8619 Personal history of other infectious and parasitic diseases: Secondary | ICD-10-CM | POA: Diagnosis not present

## 2018-12-20 DIAGNOSIS — F1320 Sedative, hypnotic or anxiolytic dependence, uncomplicated: Secondary | ICD-10-CM | POA: Diagnosis present

## 2018-12-20 DIAGNOSIS — R45851 Suicidal ideations: Secondary | ICD-10-CM | POA: Diagnosis present

## 2018-12-20 DIAGNOSIS — F411 Generalized anxiety disorder: Secondary | ICD-10-CM | POA: Diagnosis present

## 2018-12-20 DIAGNOSIS — F322 Major depressive disorder, single episode, severe without psychotic features: Secondary | ICD-10-CM | POA: Insufficient documentation

## 2018-12-20 DIAGNOSIS — F419 Anxiety disorder, unspecified: Secondary | ICD-10-CM | POA: Diagnosis present

## 2018-12-20 DIAGNOSIS — F332 Major depressive disorder, recurrent severe without psychotic features: Secondary | ICD-10-CM | POA: Diagnosis present

## 2018-12-20 DIAGNOSIS — A6000 Herpesviral infection of urogenital system, unspecified: Secondary | ICD-10-CM | POA: Diagnosis present

## 2018-12-20 DIAGNOSIS — G8929 Other chronic pain: Secondary | ICD-10-CM | POA: Diagnosis present

## 2018-12-20 DIAGNOSIS — Z85038 Personal history of other malignant neoplasm of large intestine: Secondary | ICD-10-CM | POA: Diagnosis not present

## 2018-12-20 DIAGNOSIS — F1910 Other psychoactive substance abuse, uncomplicated: Secondary | ICD-10-CM | POA: Diagnosis present

## 2018-12-20 DIAGNOSIS — K219 Gastro-esophageal reflux disease without esophagitis: Secondary | ICD-10-CM | POA: Diagnosis present

## 2018-12-20 DIAGNOSIS — E785 Hyperlipidemia, unspecified: Secondary | ICD-10-CM | POA: Diagnosis present

## 2018-12-20 DIAGNOSIS — M810 Age-related osteoporosis without current pathological fracture: Secondary | ICD-10-CM | POA: Diagnosis present

## 2018-12-20 DIAGNOSIS — I10 Essential (primary) hypertension: Secondary | ICD-10-CM | POA: Diagnosis present

## 2018-12-20 DIAGNOSIS — M549 Dorsalgia, unspecified: Secondary | ICD-10-CM | POA: Diagnosis present

## 2018-12-20 DIAGNOSIS — F4325 Adjustment disorder with mixed disturbance of emotions and conduct: Principal | ICD-10-CM | POA: Diagnosis present

## 2018-12-20 DIAGNOSIS — F329 Major depressive disorder, single episode, unspecified: Secondary | ICD-10-CM | POA: Diagnosis not present

## 2018-12-20 LAB — URINE DRUG SCREEN, QUALITATIVE (ARMC ONLY)
Amphetamines, Ur Screen: NOT DETECTED
Barbiturates, Ur Screen: NOT DETECTED
Benzodiazepine, Ur Scrn: POSITIVE — AB
Cannabinoid 50 Ng, Ur ~~LOC~~: NOT DETECTED
Cocaine Metabolite,Ur ~~LOC~~: NOT DETECTED
MDMA (Ecstasy)Ur Screen: NOT DETECTED
Methadone Scn, Ur: NOT DETECTED
Opiate, Ur Screen: NOT DETECTED
Phencyclidine (PCP) Ur S: NOT DETECTED
Tricyclic, Ur Screen: POSITIVE — AB

## 2018-12-20 LAB — SARS CORONAVIRUS 2 BY RT PCR (HOSPITAL ORDER, PERFORMED IN ~~LOC~~ HOSPITAL LAB): SARS Coronavirus 2: NEGATIVE

## 2018-12-20 MED ORDER — PANTOPRAZOLE SODIUM 40 MG PO TBEC: 40.0000 mg | DELAYED_RELEASE_TABLET | Freq: Two times a day (BID) | ORAL | Status: DC | PRN

## 2018-12-20 MED ORDER — ALUM & MAG HYDROXIDE-SIMETH 200-200-20 MG/5ML PO SUSP: 30.0000 mL | ORAL | Status: DC | PRN

## 2018-12-20 MED ORDER — AMITRIPTYLINE HCL 50 MG PO TABS
50.0000 mg | ORAL_TABLET | Freq: Every day | ORAL | Status: DC
  Administered 2018-12-20: 01:00:00 50 mg via ORAL
  Filled 2018-12-20: qty 1

## 2018-12-20 MED ORDER — AMITRIPTYLINE HCL 50 MG PO TABS
50.0000 mg | ORAL_TABLET | Freq: Every day | ORAL | Status: DC
  Administered 2018-12-20 – 2018-12-28 (×9): 50 mg via ORAL
  Filled 2018-12-20 (×9): qty 1

## 2018-12-20 MED ORDER — MAGNESIUM HYDROXIDE 400 MG/5ML PO SUSP: 30.0000 mL | Freq: Every day | ORAL | Status: DC | PRN

## 2018-12-20 MED ORDER — CYCLOBENZAPRINE HCL 10 MG PO TABS: 10.0000 mg | ORAL_TABLET | Freq: Every day | ORAL | Status: DC | PRN

## 2018-12-20 MED ORDER — GABAPENTIN 400 MG PO CAPS
800.0000 mg | ORAL_CAPSULE | Freq: Three times a day (TID) | ORAL | Status: DC
  Administered 2018-12-20 – 2018-12-29 (×26): 800 mg via ORAL
  Filled 2018-12-20 (×26): qty 2

## 2018-12-20 MED ORDER — FLUTICASONE PROPIONATE 50 MCG/ACT NA SUSP
1.0000 | Freq: Every day | NASAL | Status: DC | PRN
  Filled 2018-12-20: qty 16

## 2018-12-20 MED ORDER — GABAPENTIN 400 MG PO CAPS
800.0000 mg | ORAL_CAPSULE | Freq: Three times a day (TID) | ORAL | Status: DC
  Administered 2018-12-20: 800 mg via ORAL
  Filled 2018-12-20 (×3): qty 2

## 2018-12-20 MED ORDER — MENTHOL 3 MG MT LOZG
1.0000 | LOZENGE | OROMUCOSAL | Status: DC | PRN
  Administered 2018-12-21: 16:00:00 3 mg via ORAL
  Filled 2018-12-20: qty 9

## 2018-12-20 MED ORDER — ALPRAZOLAM 0.5 MG PO TABS
2.0000 mg | ORAL_TABLET | Freq: Once | ORAL | Status: AC
  Administered 2018-12-20: 2 mg via ORAL
  Filled 2018-12-20: qty 4

## 2018-12-20 MED ORDER — ACETAMINOPHEN 325 MG PO TABS
650.0000 mg | ORAL_TABLET | Freq: Four times a day (QID) | ORAL | Status: DC | PRN
  Administered 2018-12-22 – 2018-12-28 (×9): 650 mg via ORAL
  Filled 2018-12-20 (×10): qty 2

## 2018-12-20 MED ORDER — ATENOLOL 50 MG PO TABS
50.0000 mg | ORAL_TABLET | Freq: Every day | ORAL | Status: DC
  Administered 2018-12-20 – 2018-12-23 (×3): 50 mg via ORAL
  Filled 2018-12-20 (×4): qty 1

## 2018-12-20 MED ORDER — CEPASTAT 14.5 MG MT LOZG
1.0000 | LOZENGE | OROMUCOSAL | Status: DC | PRN
  Filled 2018-12-20 (×3): qty 9

## 2018-12-20 MED ORDER — ATENOLOL 25 MG PO TABS: 50.0000 mg | ORAL_TABLET | Freq: Two times a day (BID) | ORAL | Status: DC

## 2018-12-20 MED ORDER — ALPRAZOLAM 0.5 MG PO TABS
2.0000 mg | ORAL_TABLET | Freq: Once | ORAL | Status: AC
  Administered 2018-12-20: 17:00:00 2 mg via ORAL
  Filled 2018-12-20: qty 4

## 2018-12-20 MED ORDER — ATENOLOL 25 MG PO TABS
50.0000 mg | ORAL_TABLET | Freq: Every day | ORAL | Status: DC
  Administered 2018-12-20: 50 mg via ORAL
  Filled 2018-12-20: qty 2

## 2018-12-20 MED ORDER — CYCLOBENZAPRINE HCL 10 MG PO TABS
10.0000 mg | ORAL_TABLET | Freq: Every day | ORAL | Status: DC | PRN
  Administered 2018-12-20 – 2018-12-29 (×11): 10 mg via ORAL
  Filled 2018-12-20 (×12): qty 1

## 2018-12-20 NOTE — BH Assessment (Signed)
Patient is to be admitted to [facility] by Dr. Donald Pore.  Attending Physician will be Dr. Weber Cooks.   Patient has been assigned to room 301, by [facility] Charge Nurse Demetria.   Intake Paper Work has been signed and placed on patient chart.   ER staff is aware of the admission:  Nitchia, ER Secretary    Dr. Joni Fears, ER MD   Tomasa Hosteller, Patient's Nurse   Elberta Fortis, Patient Access.

## 2018-12-20 NOTE — Plan of Care (Signed)
New admission.  Problem: Education: Goal: Knowledge of [place] [facility] Education information/materials will improve Outcome: Not Progressing Goal: Emotional status will improve Outcome: Not Progressing Goal: Mental status will improve Outcome: Not Progressing Goal: Verbalization of understanding the information provided will improve Outcome: Not Progressing   Problem: Health Behavior/Discharge Planning: Goal: Compliance with treatment plan for underlying cause of condition will improve Outcome: Not Progressing   Problem: Safety: Goal: Periods of time without injury will increase Outcome: Not Progressing   Problem: Self-Concept: Goal: Level of anxiety will decrease Outcome: Not Progressing Goal: Ability to modify response to factors that promote anxiety will improve Outcome: Not Progressing
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
## 2018-12-20 NOTE — ED Notes (Signed)
Patient said she is feeling dizzy while having an interview with TTS. VS are done and within normal limits.

## 2018-12-20 NOTE — ED Notes (Signed)
Hourly rounding reveals patient in room. No complaints, stable, in no acute distress. Q15 minute rounds and monitoring via Rover and Officer to continue.   

## 2018-12-20 NOTE — ED Notes (Signed)
Report given to receiving nurse
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
## 2018-12-20 NOTE — ED Notes (Addendum)
asumed care of patient. Patient sleeping comfortably, as per prior nrse, patient slept through the night with no withdrawal signs or symptoms. Patient currently sleeping. Will awake for scheduled med and vital signs as scheduled. Safety maintained. Will monitor.

## 2018-12-20 NOTE — Tx Team (Signed)
Initial Treatment Plan 12/20/2018 6:29 PM [employer] [id number]    PATIENT STRESSORS: Financial difficulties Marital or family conflict   PATIENT STRENGTHS: Ability for insight Communication skills General fund of knowledge Motivation for treatment/growth   PATIENT IDENTIFIED PROBLEMS: Anxiety  Family issues                   DISCHARGE CRITERIA:  Ability to meet basic life and health needs Improved stabilization in mood, thinking, and/or behavior Motivation to continue treatment in a less acute level of care Reduction of life-threatening or endangering symptoms to within safe limits  PRELIMINARY DISCHARGE PLAN: Outpatient therapy Participate in family therapy  PATIENT/FAMILY INVOLVEMENT: This treatment plan has been presented to and reviewed with the patient, Jessica Larson. The patient has been given the opportunity to ask questions and make suggestions.  Dontee Jaso, RN 12/20/2018, 6:29 PM
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
## 2018-12-20 NOTE — ED Notes (Signed)
Attempt to call report. Nurse is on her lunch break. As per charge nurse will have her call me back for report.

## 2018-12-21 DIAGNOSIS — F4325 Adjustment disorder with mixed disturbance of emotions and conduct: Principal | ICD-10-CM

## 2018-12-21 MED ORDER — PROMETHAZINE HCL 25 MG PO TABS: 25.0000 mg | ORAL_TABLET | Freq: Four times a day (QID) | ORAL | Status: DC | PRN

## 2018-12-21 MED ORDER — ALPRAZOLAM 0.5 MG PO TABS
1.0000 mg | ORAL_TABLET | Freq: Four times a day (QID) | ORAL | Status: DC | PRN
  Administered 2018-12-21 – 2018-12-29 (×23): 1 mg via ORAL
  Filled 2018-12-21 (×23): qty 2

## 2018-12-21 MED ORDER — ATORVASTATIN CALCIUM 20 MG PO TABS
40.0000 mg | ORAL_TABLET | Freq: Every day | ORAL | Status: DC
  Administered 2018-12-21 – 2018-12-28 (×8): 40 mg via ORAL
  Filled 2018-12-21 (×8): qty 2

## 2018-12-21 NOTE — [facility] Group Notes (Signed)
Balance In Life 12/21/2018 1PM  Type of Therapy/Topic:  Group Therapy:  Balance in Life  Participation Level:  Did Not Attend  Description of Group:   This group will address the concept of balance and how it feels and looks when one is unbalanced. Patients will be encouraged to process areas in their lives that are out of balance and identify reasons for remaining unbalanced. Facilitators will guide patients in utilizing problem-solving interventions to address and correct the stressor making their life unbalanced. Understanding and applying boundaries will be explored and addressed for obtaining and maintaining a balanced life. Patients will be encouraged to explore ways to assertively make their unbalanced needs known to significant others in their lives, using other group members and facilitator for support and feedback.  Therapeutic Goals: 1. Patient will identify two or more emotions or situations they have that consume much of in their lives. 2. Patient will identify signs/triggers that life has become out of balance:  3. Patient will identify two ways to set boundaries in order to achieve balance in their lives:  4. Patient will demonstrate ability to communicate their needs through discussion and/or role plays  Summary of Patient Progress:    Therapeutic Modalities:   Cognitive Behavioral Therapy Solution-Focused Therapy Assertiveness Training  Zyriah Mask T Jammal Sarr, LCSW  

## 2018-12-21 NOTE — Tx Team (Addendum)
Interdisciplinary Treatment and Diagnostic Plan Update  12/21/2018 Time of Session: 9am Jessica Larson MRN: [id number]  Principal Diagnosis: <principal problem not specified>  Secondary Diagnoses: Active Problems:   MDD (major depressive disorder), severe (HCC)   Current Medications:  Current Facility-Administered Medications  Medication Dose Route Frequency Provider Last Rate Last Dose  . acetaminophen (TYLENOL) tablet 650 mg  650 mg Oral Q6H PRN Caroline Sauger, NP      . alum & mag hydroxide-simeth (MAALOX/MYLANTA) 200-200-20 MG/5ML suspension 30 mL  30 mL Oral Q4H PRN Caroline Sauger, NP      . amitriptyline (ELAVIL) tablet 50 mg  50 mg Oral QHS Caroline Sauger, NP   50 mg at 12/20/18 2226  . atenolol (TENORMIN) tablet 50 mg  50 mg Oral Daily Caroline Sauger, NP   50 mg at 12/21/18 0750  . cyclobenzaprine (FLEXERIL) tablet 10 mg  10 mg Oral Daily PRN Caroline Sauger, NP   10 mg at 12/20/18 2227  . fluticasone (FLONASE) 50 MCG/ACT nasal spray 1 spray  1 spray Each Nare Daily PRN Caroline Sauger, NP      . gabapentin (NEURONTIN) capsule 800 mg  800 mg Oral TID Caroline Sauger, NP   800 mg at 12/21/18 0750  . magnesium hydroxide (MILK OF MAGNESIA) suspension 30 mL  30 mL Oral Daily PRN Caroline Sauger, NP      . menthol-cetylpyridinium (CEPACOL) lozenge 3 mg  1 lozenge Oral PRN Clapacs, John T, MD      . pantoprazole (PROTONIX) EC tablet 40 mg  40 mg Oral BID PRN Caroline Sauger, NP       PTA Medications: Medications Prior to Admission  Medication Sig Dispense Refill Last Dose  . alprazolam (XANAX) 2 MG tablet Take 2 mg by mouth 4 (four) times daily.     Marland Kitchen amitriptyline (ELAVIL) 50 MG tablet Take 50 mg by mouth at bedtime.     Marland Kitchen atenolol (TENORMIN) 50 MG tablet Take 50 mg by mouth 2 (two) times daily.     . Cholecalciferol (VITAMIN D3) 5000 units TBDP Take 1 tablet by mouth daily.      . cyclobenzaprine (FLEXERIL) 10 MG tablet Take 10 mg by  mouth daily as needed for muscle spasms.      . fluticasone (FLONASE) 50 MCG/ACT nasal spray Place 1 spray into both nostrils daily as needed for allergies.      Marland Kitchen gabapentin (NEURONTIN) 800 MG tablet Take 800 mg by mouth 3 (three) times daily.      Marland Kitchen HYDROcodone-acetaminophen (NORCO/VICODIN) 5-325 MG tablet Take 1 tablet by mouth every 6 (six) hours as needed for moderate pain. 12 tablet 0   . pantoprazole (PROTONIX) 40 MG tablet Take 40 mg by mouth 2 (two) times daily as needed (indigestion).      . promethazine (PHENERGAN) 25 MG tablet Take 25 mg by mouth every 8 (eight) hours as needed for nausea or vomiting.        Patient Stressors: Financial difficulties Marital or family conflict  Patient Strengths: Ability for Engineer, manufacturing fund of knowledge Motivation for treatment/growth  Treatment Modalities: Medication Management, Group therapy, Case management,  1 to 1 session with clinician, Psychoeducation, Recreational therapy.   Physician Treatment Plan for Primary Diagnosis: <principal problem not specified> Long Term Goal(s):     Short Term Goals:    Medication Management: Evaluate patient's response, side effects, and tolerance of medication regimen.  Therapeutic Interventions: 1 to 1 sessions, Unit Group sessions and Medication administration.  Evaluation of Outcomes: Not Met  Physician Treatment Plan for Secondary Diagnosis: Active Problems:   MDD (major depressive disorder), severe ([place])  Long Term Goal(s):     Short Term Goals:       Medication Management: Evaluate patient's response, side effects, and tolerance of medication regimen.  Therapeutic Interventions: 1 to 1 sessions, Unit Group sessions and Medication administration.  Evaluation of Outcomes: Not Met   RN Treatment Plan for Primary Diagnosis: <principal problem not specified> Long Term Goal(s): Knowledge of disease and therapeutic regimen to maintain health will improve  Short  Term Goals: Ability to verbalize feelings will improve, Ability to disclose and discuss suicidal ideas, Ability to identify and develop effective coping behaviors will improve and Compliance with prescribed medications will improve  Medication Management: RN will administer medications as ordered by provider, will assess and evaluate patient's response and provide education to patient for prescribed medication. RN will report any adverse and/or side effects to prescribing provider.  Therapeutic Interventions: 1 on 1 counseling sessions, Psychoeducation, Medication administration, Evaluate responses to treatment, Monitor vital signs and CBGs as ordered, Perform/monitor CIWA, COWS, AIMS and Fall Risk screenings as ordered, Perform wound care treatments as ordered.  Evaluation of Outcomes: Not Met   LCSW Treatment Plan for Primary Diagnosis: <principal problem not specified> Long Term Goal(s): Safe transition to appropriate next level of care at discharge, Engage patient in therapeutic group addressing interpersonal concerns.  Short Term Goals: Engage patient in aftercare planning with referrals and resources  Therapeutic Interventions: Assess for all discharge needs, 1 to 1 time with Social worker, Explore available resources and support systems, Assess for adequacy in community support network, Educate family and significant other(s) on suicide prevention, Complete Psychosocial Assessment, Interpersonal group therapy.  Evaluation of Outcomes: Not Met   Progress in Treatment: Attending groups: No. Participating in groups: No. Taking medication as prescribed: Yes. Toleration medication: Yes. Family/Significant other contact made: No, will contact:  when pt gives consent Patient understands diagnosis: Yes. Discussing patient identified problems/goals with staff: Yes. Medical problems stabilized or resolved: No. Denies suicidal/homicidal ideation: Yes. Issues/concerns per patient  self-inventory: No. Other: NA  New problem(s) identified: No, Describe:  None reported  New Short Term/Long Term Goal(s):Attend outpatient treatment, take medication as prescribed, develop and implement healthy coping methods  Patient Goals:  I just want things to be truthful  Discharge Plan or Barriers: Pt will return home and follow up with outpatient treatment  Reason for Continuation of Hospitalization: Medication stabilization  Estimated Length of Stay: 1-7 days  Recreational Therapy: Patient Stressors: N/A Patient Goal: Patient will engage in groups without prompting or encouragement from LRT x3 group sessions within 5 recreation therapy group sessions   Attendees: Patient:Jessica Larson 12/21/2018 11:06 AM  Physician: Alethia Berthold 12/21/2018 11:06 AM  Nursing: Polly Cobia 12/21/2018 11:06 AM  RN Care Manager: 12/21/2018 11:06 AM  Social Worker: Sanjuana Kava [place] 12/21/2018 11:06 AM  Recreational Therapist: Isaias Sakai Shiquita Collignon 12/21/2018 11:06 AM  Other:  12/21/2018 11:06 AM  Other:  12/21/2018 11:06 AM  Other: 12/21/2018 11:06 AM    Scribe for Treatment Team: Yvette Rack, LCSW 12/21/2018 11:06 AM
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
## 2018-12-21 NOTE — BHH Suicide Risk Assessment (Signed)
[place] INPATIENT:  Family/Significant Other Suicide Prevention Education  Suicide Prevention Education:  Patient Refusal for Family/Significant Other Suicide Prevention Education: The patient Jessica Larson has refused to provide written consent for family/significant other to be provided Family/Significant Other Suicide Prevention Education during admission and/or prior to discharge.  Physician notified.  SPE completed with pt, as pt refused to consent to family contact. SPI pamphlet provided to pt and pt was encouraged to share information with support network, ask questions, and talk about any concerns relating to SPE. Pt denies access to guns/firearms and verbalized understanding of information provided. Mobile Crisis information also provided to pt.    [place] MSW LCSW 12/21/2018, 10:56 AM
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
## 2018-12-21 NOTE — Plan of Care (Signed)
Patient aware of information received   and able to verbalize  understanding of education presented  and unit programing .  Emotional and mental  status improved. Voice of no safety concerns.  Encourage to work on [employer] anxiety   concerns and decision making .   Problem: Self-Concept: Goal: Level of anxiety will decrease Outcome: Progressing Goal: Ability to modify response to factors that promote anxiety will improve Outcome: Progressing   Problem: Safety: Goal: Periods of time without injury will increase Outcome: Progressing   Problem: Health Behavior/Discharge Planning: Goal: Compliance with treatment plan for underlying cause of condition will improve Outcome: Progressing   Problem: Education: Goal: Knowledge of [facility] Education information/materials will improve Outcome: Progressing Goal: Emotional status will improve Outcome: Progressing Goal: Mental status will improve Outcome: Progressing Goal: Verbalization of understanding the information provided will improve Outcome: Progressing

## 2018-12-21 NOTE — Progress Notes (Signed)
Recreation Therapy Notes  Date: 12/21/2018  Time: 9:30 am    Location: Craft room   Behavioral response: N/A   Intervention Topic: Communication   Discussion/Intervention: Patient did not attend group.   Clinical Observations/Feedback:  Patient did not attend group.   Kassius Battiste LRT/CTRS        Luwana Butrick 12/21/2018 12:00 PM

## 2018-12-21 NOTE — Plan of Care (Signed)
Patient is adjusting well in the unit and maintaining safety of self and other , patient is attending groups , and socializing appropriately with peers without any issues, denies any SI/HI/AVH , voice no physical complains , complying with her medication regimen and behavior is appropriate , mood is good and affect is good resting with out any safety concerns only requiring 15 minutes safety rounding no distress.   Problem: Education: Goal: Knowledge of [facility] Education information/materials will improve Outcome: Progressing Goal: Emotional status will improve Outcome: Progressing Goal: Mental status will improve Outcome: Progressing Goal: Verbalization of understanding the information provided will improve Outcome: Progressing   Problem: Health Behavior/Discharge Planning: Goal: Compliance with treatment plan for underlying cause of condition will improve Outcome: Progressing   Problem: Safety: Goal: Periods of time without injury will increase Outcome: Progressing   Problem: Self-Concept: Goal: Level of anxiety will decrease Outcome: Progressing Goal: Ability to modify response to factors that promote anxiety will improve Outcome: Progressing

## 2018-12-21 NOTE — Plan of Care (Signed)
  Problem: Self-Concept: Goal: Level of anxiety will decrease Outcome: Progressing  Patient appears less anxious interacting appropriately.
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
## 2018-12-21 NOTE — Progress Notes (Signed)
CSW provided pt with information on [facility] due to pt reporting she needs a new living situation. CSW also provided pt with phone number for [place] worker pt reported helped her in the past, [place].
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
## 2018-12-21 NOTE — Progress Notes (Signed)
Recreation Therapy Notes  INPATIENT RECREATION THERAPY ASSESSMENT  Patient Details Name: Shayna Furrer MRN: [id number] DOB: 16-May-1955 Today's Date: 12/21/2018       Information Obtained From: Patient  Able to Participate in Assessment/Interview: Yes  Patient Presentation: Responsive  Reason for Admission (Per Patient): Active Symptoms  Patient Stressors:    Coping Skills:   Prayer, Other (Comment)(Medication, Walk dog)  Leisure Interests (2+):  Exercise - Clinical cytogeneticist)  Frequency of Recreation/Participation:    Awareness of Community Resources:     [employer]:     Current Use:    If no, Barriers?:    Expressed Interest in [place] of Residence:  Insurance underwriter  Patient Main Form of Transportation: Other (Comment)(Daughter)  Patient Strengths:  I have a huge heart  Patient Identified Areas of Improvement:  getting resources  Patient Goal for Hospitalization:  To get help with my situation  Current SI (including self-harm):  No  Current HI:  No  Current AVH: No  Staff Intervention Plan: Group Attendance, Collaborate with Interdisciplinary Treatment Team  Consent to Intern Participation: N/A  Jatavius Ellenwood 12/21/2018, 2:58 PM
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
## 2018-12-22 NOTE — Progress Notes (Signed)
Patient blood pressure and heart rate low this morning, so medication held. Will notify attending during treatment team meeting.

## 2018-12-22 NOTE — Progress Notes (Signed)
[facility] MD Progress Note  12/22/2018 3:19 PM Jessica Larson  MRN:  [id number] Subjective: Follow-up for this patient with a history of anxiety.  She tells me today that her chief concerns are that she should regain access to her pocket book and that she should regain possession of her dogs.  She wants to be able to move away from her daughter and live independently.  She has made no effort to contact the daughter and in fact has evaded the daughter's attempts to call here.  Patient is not reporting depression and she is not reporting any suicidal ideation and does not present as psychotic.  She is not tremulous and does not present as being in any form of withdrawal. Principal Problem: Adjustment disorder with mixed disturbance of emotions and conduct Diagnosis: Principal Problem:   Adjustment disorder with mixed disturbance of emotions and conduct Active Problems:   Anxiety  Total Time spent with patient: 30 minutes  Past Psychiatric History: Patient evidently has a history of chronic anxiety disorder for which she was maintained on Xanax as an outpatient  Past Medical History:  Past Medical History:  Diagnosis Date  . Abnormal gait   . Affective disorder ([place])   . Anxiety   . Atypical squamous cells of undetermined significance (ASCUS) on Papanicolaou smear of cervix   . Cancer ([place])   . Chronic back pain   . Chronic prescription benzodiazepine use   . Colon cancer ([place])   . High cholesterol   . History of loop recorder   . History of shingles   . Hypertension   . Osteoporosis   . Tremor     Past Surgical History:  Procedure Laterality Date  . COLON SURGERY     Family History: History reviewed. No pertinent family history. Family Psychiatric  History: By the patient's report, her daughter has a problem with abusing benzodiazepines Social History:  Social History   Substance and Sexual Activity  Alcohol Use Not Currently  . Frequency: Never     Social History   Substance  and Sexual Activity  Drug Use Never    Social History   Socioeconomic History  . Marital status: Divorced    Spouse name: Not on file  . Number of children: Not on file  . Years of education: Not on file  . Highest education level: Not on file  Occupational History  . Not on file  Social Needs  . Financial resource strain: Not on file  . Food insecurity    Worry: Not on file    Inability: Not on file  . Transportation needs    Medical: Not on file    Non-medical: Not on file  Tobacco Use  . Smoking status: Former Smoker    Years: 0.00    Quit date: 09/15/2012    Years since quitting: 6.2  . Smokeless tobacco: Never Used  Substance and Sexual Activity  . Alcohol use: Not Currently    Frequency: Never  . Drug use: Never  . Sexual activity: Not on file  Lifestyle  . Physical activity    Days per week: Not on file    Minutes per session: Not on file  . Stress: Not on file  Relationships  . Social Herbalist on phone: Not on file    Gets together: Not on file    Attends religious service: Not on file    Active member of club or organization: Not on file    Attends meetings  of clubs or organizations: Not on file    Relationship status: Not on file  Other Topics Concern  . Not on file  Social History Narrative   Lives at home with daughter, daughter's partner, two grandchildren.   Additional Social History:    Pain Medications: see PTA Prescriptions: see PTA Over the Counter: see PTA                    Sleep: Negative  Appetite:  Negative  Current Medications: Current Facility-Administered Medications  Medication Dose Route Frequency Provider Last Rate Last Dose  . acetaminophen (TYLENOL) tablet 650 mg  650 mg Oral Q6H PRN Caroline Sauger, NP   650 mg at 12/22/18 1015  . ALPRAZolam (XANAX) tablet 1 mg  1 mg Oral Q6H PRN Bandon Sherwin, Madie Reno, MD   1 mg at 12/22/18 1447  . alum & mag hydroxide-simeth (MAALOX/MYLANTA) 200-200-20 MG/5ML  suspension 30 mL  30 mL Oral Q4H PRN Caroline Sauger, NP      . amitriptyline (ELAVIL) tablet 50 mg  50 mg Oral QHS Caroline Sauger, NP   50 mg at 12/21/18 2158  . atenolol (TENORMIN) tablet 50 mg  50 mg Oral Daily Caroline Sauger, NP   50 mg at 12/21/18 0750  . atorvastatin (LIPITOR) tablet 40 mg  40 mg Oral q1800 Durand Wittmeyer, Madie Reno, MD   40 mg at 12/21/18 1618  . cyclobenzaprine (FLEXERIL) tablet 10 mg  10 mg Oral Daily PRN Caroline Sauger, NP   10 mg at 12/22/18 1015  . fluticasone (FLONASE) 50 MCG/ACT nasal spray 1 spray  1 spray Each Nare Daily PRN Caroline Sauger, NP      . gabapentin (NEURONTIN) capsule 800 mg  800 mg Oral TID Caroline Sauger, NP   800 mg at 12/22/18 1247  . magnesium hydroxide (MILK OF MAGNESIA) suspension 30 mL  30 mL Oral Daily PRN Caroline Sauger, NP      . menthol-cetylpyridinium (CEPACOL) lozenge 3 mg  1 lozenge Oral PRN Dionisios Ricci, Madie Reno, MD   3 mg at 12/21/18 1618  . pantoprazole (PROTONIX) EC tablet 40 mg  40 mg Oral BID PRN Caroline Sauger, NP      . promethazine (PHENERGAN) tablet 25 mg  25 mg Oral Q6H PRN Remee Charley, Madie Reno, MD        Lab Results: No results found for this or any previous visit (from the past 48 hour(s)).  Blood Alcohol level:  Lab Results  Component Value Date   ETH <10 [id number]    Metabolic Disorder Labs: No results found for: HGBA1C, MPG No results found for: PROLACTIN No results found for: CHOL, TRIG, HDL, CHOLHDL, VLDL, LDLCALC  Physical Findings: AIMS:  , ,  ,  ,    CIWA:    COWS:     Musculoskeletal: Strength & Muscle Tone: within normal limits Gait & Station: normal Patient leans: N/A  Psychiatric Specialty Exam: Physical Exam  Nursing note and vitals reviewed. Constitutional: She appears well-developed and well-nourished.  HENT:  Head: Normocephalic and atraumatic.  Eyes: Pupils are equal, round, and reactive to light. Conjunctivae are normal.  Neck: Normal range of motion.   Cardiovascular: Regular rhythm and normal heart sounds.  Respiratory: Effort normal. No respiratory distress.  GI: Soft.  Musculoskeletal: Normal range of motion.  Neurological: She is alert.  Skin: Skin is warm and dry.  Psychiatric: She has a normal mood and affect. Her behavior is normal. Judgment and thought content normal.    Review  of Systems  Constitutional: Negative.   HENT: Negative.   Eyes: Negative.   Respiratory: Negative.   Cardiovascular: Negative.   Gastrointestinal: Negative.   Musculoskeletal: Negative.   Skin: Negative.   Neurological: Negative.   Psychiatric/Behavioral: Negative.     Blood pressure (!) 147/65, pulse 63, temperature 98.4 F (36.9 C), temperature source Oral, resp. rate 17, height 5\' 4"  (1.626 m), weight 64.4 kg, SpO2 99 %.Body mass index is 24.37 kg/m.  General Appearance: Casual  Eye Contact:  Good  Speech:  Clear and Coherent  Volume:  Normal  Mood:  Euthymic  Affect:  Congruent  Thought Process:  Goal Directed  Orientation:  Full (Time, Place, and Person)  Thought Content:  Logical  Suicidal Thoughts:  No  Homicidal Thoughts:  No  Memory:  Immediate;   Fair Recent;   Fair Remote;   Fair  Judgement:  Fair  Insight:  Fair  Psychomotor Activity:  Normal  Concentration:  Concentration: Fair  Recall:  [employer] of Knowledge:  Fair  Language:  Fair  Akathisia:  No  Handed:  Right  AIMS (if indicated):     Assets:  Desire for Improvement Financial Resources/Insurance Physical Health  ADL's:  Intact  Cognition:  WNL  Sleep:  Number of Hours: 6.75     Treatment Plan Summary: Daily contact with patient to assess and evaluate symptoms and progress in treatment, Medication management and Plan Patient does not currently have any acute psychiatric complaints.  I am allowing her to have a maximum of 4 mg of alprazolam a day and she is showing no signs of withdrawal and does not appear to be anxious.  I do not think there would be any  reason to increase the dose any higher than this.  Patient seems to have an expectation that somehow it will be made to happen that we will obtain her belongings for her and get her a new place to live.  I tried to gently disabuse her of this idea today but she is rather resistant.  Patient is encouraged to contact her daughter if she needs to get belongings from her and wants to find herself a place to stay.  No other change to acute treatment.  Alethia Berthold, MD 12/22/2018, 3:19 PM

## 2018-12-22 NOTE — [facility] Group Notes (Signed)
LCSW Group Therapy Note  12/22/2018 1:00 PM  Type of Therapy and Topic:  Group Therapy:  Feelings around Relapse and Recovery  Participation Level:  None   Description of Group:    Patients in this group will discuss emotions they experience before and after a relapse. They will process how experiencing these feelings, or avoidance of experiencing them, relates to having a relapse. Facilitator will guide patients to explore emotions they have related to recovery. Patients will be encouraged to process which emotions are more powerful. They will be guided to discuss the emotional reaction significant others in their lives may have to their relapse or recovery. Patients will be assisted in exploring ways to respond to the emotions of others without this contributing to a relapse.  Therapeutic Goals: 1. Patient will identify two or more emotions that lead to a relapse for them 2. Patient will identify two emotions that result when they relapse 3. Patient will identify two emotions related to recovery 4. Patient will demonstrate ability to communicate their needs through discussion and/or role plays   Summary of Patient Progress: Patient was present in group, however, did not engage in discussion.  Patient did appear to be attentive in group.   Therapeutic Modalities:   Cognitive Behavioral Therapy Solution-Focused Therapy Assertiveness Training Relapse Prevention Therapy   Assunta Curtis, MSW, LCSW 12/22/2018 10:08 AM

## 2018-12-22 NOTE — Progress Notes (Signed)
Recreation Therapy Notes   Date: 12/22/2018  Time: 9:30 am   Location: Craft room  Behavioral response: Appropriate   Intervention Topic: Teamwork   Discussion/Intervention:  Group content on today was focused on teamwork. The group identified what teamwork is. Individuals described who is a part of their team. Patients expressed why they thought teamwork is important. The group stated reasons why they thought it was easier to work with a Dance movement psychotherapist team. Individuals discussed some positives and negatives of working with a team. Patients gave examples of past experiences they had while working with a team. The group participated in the intervention "Story in a bag", patients were in groups and were able to test their skill in a team setting.  Clinical Observations/Feedback:  Patient came to group and defined team work as trusting and not getting let down.She stated that working as a team makes her stronger. Individual was social with peers and staff while participating in the intervention. Participant left group early due to unknown reasons.  Lukasz Rogus LRT/CTRS         Jessica Larson 12/22/2018 11:49 AM

## 2018-12-23 MED ORDER — ATENOLOL 50 MG PO TABS
50.0000 mg | ORAL_TABLET | Freq: Two times a day (BID) | ORAL | Status: DC
  Administered 2018-12-23: 17:00:00 50 mg via ORAL
  Filled 2018-12-23 (×3): qty 1
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
## 2018-12-23 NOTE — Plan of Care (Signed)
  Problem: Self-Concept: Goal: Level of anxiety will decrease Outcome: Progressing  Patient appears less anxious interacting appropriately with peers and staff.  
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
## 2018-12-23 NOTE — Plan of Care (Signed)
  Problem: Education: Goal: Knowledge of [facility] Education information/materials will improve Outcome: Progressing Goal: Emotional status will improve Outcome: Progressing Goal: Mental status will improve Outcome: Progressing   Problem: Safety: Goal: Periods of time without injury will increase Outcome: Progressing

## 2018-12-23 NOTE — [facility] Group Notes (Signed)
LCSW Group Therapy Note    Date/Time: 12/23/2018 2:30PM   Type of Therapy and Topic: Group Therapy: Trust and Honesty    Participation Level:  Active   Description of Group:  In this group patients will be asked to explore value of being honest. Patients will be guided to discuss their thoughts, feelings, and behaviors related to honesty and trusting in others. Patients will process together how trust and honesty relate to how we form relationships with peers, family members, and self. Each patient will be challenged to identify and express feelings of being vulnerable. Patients will discuss reasons why people are dishonest and identify alternative outcomes if one was truthful (to self or others). This group will be process-oriented, with patients participating in exploration of their own experiences as well as giving and receiving support and challenge from other group members.    Therapeutic Goals:  1. Patient will identify why honesty is important to relationships and how honesty overall affects relationships.  2. Patient will identify a situation where they lied or were lied too and the feelings, thought process, and behaviors surrounding the situation  3. Patient will identify the meaning of being vulnerable, how that feels, and how that correlates to being honest with self and others.  4. Patient will identify situations where they could have told the truth, but instead lied and explain reasons of dishonesty.    Summary of Patient Progress  Group members engaged in discussion on trust and honesty. Group members shared times where they have been dishonest or people have broken their trust and how the relationship was effected. Group members shared why people break trust, and the importance of trust in a relationship. Each group member shared a person in their life that they can trust. Patient actively participated in group. She identified her feelings about trusting others and how trust shaped  her identity when she was a child.   Therapeutic Modalities:  Cognitive Behavioral Therapy  Solution Focused Therapy  Motivational Interviewing  Brief Therapy    Netta Neat MSW, LCSW

## 2018-12-24 MED ORDER — METOPROLOL SUCCINATE ER 25 MG PO TB24
25.0000 mg | ORAL_TABLET | Freq: Every day | ORAL | Status: DC
  Administered 2018-12-24 – 2018-12-29 (×6): 25 mg via ORAL
  Filled 2018-12-24 (×6): qty 1

## 2018-12-24 MED ORDER — LISINOPRIL 20 MG PO TABS
10.0000 mg | ORAL_TABLET | Freq: Every day | ORAL | Status: DC
  Administered 2018-12-24 – 2018-12-26 (×3): 10 mg via ORAL
  Filled 2018-12-24 (×3): qty 1
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
## 2018-12-24 NOTE — [facility] Group Notes (Signed)
LCSW Group Therapy Note 12/24/2018  1:00 PM   Type of Therapy and Topic: Group Therapy: Feelings Around Returning Home & Establishing a Supportive Framework and Supporting Oneself When Supports Not Available   Participation Level: Did Not Attend   Description of Group:  Patients first processed thoughts and feelings about upcoming discharge. These included fears of upcoming changes, lack of change, new living environments, judgements and expectations from others and overall stigma of mental health issues. The group then discussed the definition of a supportive framework, what that looks and feels like, and how do to discern it from an unhealthy non-supportive network. The group identified different types of supports as well as what to do when your family/friends are less than helpful or unavailable   Therapeutic Goals  1. Patient will identify one healthy supportive network that they can use at discharge. 2. Patient will identify one factor of a supportive framework and how to tell it from an unhealthy network. 3. Patient able to identify one coping skill to use when they do not have positive supports from others. 4. Patient will demonstrate ability to communicate their needs through discussion and/or role plays.   Summary of Patient Progress:  X     Therapeutic Modalities Cognitive Behavioral Therapy Motivational Interviewing   Assunta Curtis, MSW, LCSW 12/24/2018 11:00 AM

## 2018-12-24 NOTE — Progress Notes (Signed)
B/P medication held this morning due to vital signs, will notify provider during treatment team this morning.

## 2018-12-24 NOTE — Progress Notes (Signed)
[facility] MD Progress Note  12/24/2018 9:51 AM Jessica Larson  MRN:  [id number] Subjective:  Patient is a 63 year old female admitted through the emergency department at [facility] on 12/21/2018 under involuntary commitment.  She had been placed under involuntary commitment secondary to suicidal ideation.  Objective: Patient is seen and examined.  Patient is a 63 year old female with the above-stated past psych history who is seen in follow-up.  She is essentially unchanged from yesterday.  Her main focus is that she needs someone to help her get an apartment, and she stated she will not have any funds until she can get her check on the first of the month.  She stated that she needs someone to go with her to get her stuff out of her daughter's home.  She denied any suicidal or homicidal ideation.  She denied any auditory or visual hallucinations.  Staff is attempted to talk with her about the limitations with what we have to help her with regards to housing after discharge.  Her blood pressure was elevated this morning, but the patient was bradycardic at a rate of 55.  Her Tenormin was held.  Otherwise she remains on Xanax 1 mg every 6 hours as needed and amitriptyline 50 mg p.o. nightly.  Her blood pressure this morning is 143/66, recheck of her heart rate was 62.  She is afebrile.  She slept 8.5 hours last night.  Principal Problem: Adjustment disorder with mixed disturbance of emotions and conduct Diagnosis: Principal Problem:   Adjustment disorder with mixed disturbance of emotions and conduct Active Problems:   Anxiety  Total Time spent with patient: 20 minutes  Past Psychiatric History: See admission H&P  Past Medical History:  Past Medical History:  Diagnosis Date  . Abnormal gait   . 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 Anxiety   . Atypical squamous cells of undetermined significance (ASCUS) on Papanicolaou smear of cervix   . Cancer ([place])   . Chronic back pain   . Chronic  prescription benzodiazepine use   . Colon cancer ([place])   . High cholesterol   . History of loop recorder   . History of shingles   . Hypertension   . Osteoporosis   . Tremor     Past Surgical History:  Procedure Laterality Date  . COLON SURGERY     Family History: History reviewed. No pertinent family history. Family Psychiatric  History: See admission H&P Social History:  Social History   Substance and Sexual Activity  Alcohol Use Not Currently  . Frequency: Never     Social History   Substance and Sexual Activity  Drug Use Never    Social History   Socioeconomic History  . Marital status: Divorced    Spouse name: Not on file  . Number of children: Not on file  . Years of education: Not on file  . Highest education level: Not on file  Occupational History  . Not on file  Social Needs  . Financial resource strain: Not on file  . Food insecurity    Worry: Not on file    Inability: Not on file  . Transportation needs    Medical: Not on file    Non-medical: Not on file  Tobacco Use  . Smoking status: Former Smoker    Years: 0.00    Quit date: 09/15/2012    Years since quitting: 6.2  . Smokeless tobacco: Never Used  Substance and Sexual Activity  . Alcohol use: Not Currently  Frequency: Never  . Drug use: Never  . Sexual activity: Not on file  Lifestyle  . Physical activity    Days per week: Not on file    Minutes per session: Not on file  . Stress: Not on file  Relationships  . Social Herbalist on phone: Not on file    Gets together: Not on file    Attends religious service: Not on file    Active member of club or organization: Not on file    Attends meetings of clubs or organizations: Not on file    Relationship status: Not on file  Other Topics Concern  . Not on file  Social History Narrative   Lives at home with daughter, daughter's partner, two grandchildren.   Additional Social History:    Pain Medications: see  PTA Prescriptions: see PTA Over the Counter: see PTA                    Sleep: Good  Appetite:  Good  Current Medications: Current Facility-Administered Medications  Medication Dose Route Frequency Provider Last Rate Last Dose  . acetaminophen (TYLENOL) tablet 650 mg  650 mg Oral Q6H PRN Caroline Sauger, NP   650 mg at 12/22/18 2141  . ALPRAZolam Duanne Moron) tablet 1 mg  1 mg Oral Q6H PRN Clapacs, Madie Reno, MD   1 mg at 12/24/18 0740  . alum & mag hydroxide-simeth (MAALOX/MYLANTA) 200-200-20 MG/5ML suspension 30 mL  30 mL Oral Q4H PRN Caroline Sauger, NP      . amitriptyline (ELAVIL) tablet 50 mg  50 mg Oral QHS Caroline Sauger, NP   50 mg at 12/23/18 2206  . atenolol (TENORMIN) tablet 50 mg  50 mg Oral BID Sharma Covert, MD   Stopped at 12/24/18 [phone]  . atorvastatin (LIPITOR) tablet 40 mg  40 mg Oral q1800 Clapacs, Madie Reno, MD   40 mg at 12/23/18 1724  . cyclobenzaprine (FLEXERIL) tablet 10 mg  10 mg Oral Daily PRN Caroline Sauger, NP   10 mg at 12/23/18 2207  . fluticasone (FLONASE) 50 MCG/ACT nasal spray 1 spray  1 spray Each Nare Daily PRN Caroline Sauger, NP      . gabapentin (NEURONTIN) capsule 800 mg  800 mg Oral TID Caroline Sauger, NP   800 mg at 12/24/18 0741  . magnesium hydroxide (MILK OF MAGNESIA) suspension 30 mL  30 mL Oral Daily PRN Caroline Sauger, NP      . menthol-cetylpyridinium (CEPACOL) lozenge 3 mg  1 lozenge Oral PRN Clapacs, Madie Reno, MD   3 mg at 12/21/18 1618  . pantoprazole (PROTONIX) EC tablet 40 mg  40 mg Oral BID PRN Caroline Sauger, NP      . promethazine (PHENERGAN) tablet 25 mg  25 mg Oral Q6H PRN Clapacs, Madie Reno, MD        Lab Results: No results found for this or any previous visit (from the past 48 hour(s)).  Blood Alcohol level:  Lab Results  Component Value Date   ETH <10 [id number]    Metabolic Disorder Labs: No results found for: HGBA1C, MPG No results found for: PROLACTIN No results found for:  CHOL, TRIG, HDL, CHOLHDL, VLDL, LDLCALC  Physical Findings: AIMS:  , ,  ,  ,    CIWA:    COWS:     Musculoskeletal: Strength & Muscle Tone: within normal limits Gait & Station: normal Patient leans: N/A  Psychiatric Specialty Exam: Physical Exam  Nursing note  and vitals reviewed. Constitutional: She is oriented to person, place, and time. She appears well-developed and well-nourished.  HENT:  Head: Normocephalic and atraumatic.  Respiratory: Effort normal.  Neurological: She is alert and oriented to person, place, and time.    ROS  Blood pressure (!) 143/66, pulse 62, temperature 98.1 F (36.7 C), temperature source Oral, resp. rate 18, height 5\' 4"  (1.626 m), weight 64.4 kg, SpO2 98 %.Body mass index is 24.37 kg/m.  General Appearance: Casual  Eye Contact:  Good  Speech:  Normal Rate  Volume:  Normal  Mood:  Anxious  Affect:  Congruent  Thought Process:  Coherent and Descriptions of Associations: Circumstantial  Orientation:  Full (Time, Place, and Person)  Thought Content:  Logical  Suicidal Thoughts:  No  Homicidal Thoughts:  No  Memory:  Immediate;   Fair Recent;   Fair Remote;   Fair  Judgement:  Intact  Insight:  Lacking  Psychomotor Activity:  Normal  Concentration:  Concentration: Fair and Attention Span: Fair  Recall:  [employer] of Knowledge:  Fair  Language:  Good  Akathisia:  Negative  Handed:  Right  AIMS (if indicated):     Assets:  Desire for Improvement Resilience  ADL's:  Intact  Cognition:  WNL  Sleep:  Number of Hours: 8.5     Treatment Plan Summary: Daily contact with patient to assess and evaluate symptoms and progress in treatment, Medication management and Plan : Patient is seen and examined.  Patient is a 63 year old female with the above-stated past psychiatric history who is seen in follow-up.   Diagnosis: #1 generalized anxiety disorder, #2 benzodiazepine dependence, #3 chronic narcotic use, #4 hypertension, #5 hyperlipidemia, #6  chronic pain, #7 GERD, #8 genital herpes  Patient is seen in follow-up.  She is essentially unchanged from yesterday.  She is having a hard time accepting the fact that the hospital is unable to provide assistance with housing after discharge except for referrals.  Social work is given her information to contact social services etc. as an outpatient.  We discussed possible discharge tomorrow.  She stated her check does not arrive until the first.  Her blood pressure remains elevated, but her previous dosage of Tenormin has made her bradycardic.  She is essentially asymptomatic.  I am going to change her blood pressure medicine to metoprolol XL 25 mg p.o. daily, and I will add lisinopril 10 mg p.o. daily.  No change in her psychiatric medications.  We will repeat her EKG today to make sure that she remains in a normal rhythm.  1.  Continue Xanax 1 mg p.o. every 6 hours as needed anxiety. 2.  Continue amitriptyline 50 mg p.o. nightly for insomnia and depression. 3.  Stop Tenormin. 4.  Continue Lipitor 40 mg p.o. q. p.m. for hyperlipidemia. 5.  Continue Flexeril 10 mg p.o. daily as needed muscle spasms. 6.  Continue gabapentin 800 mg p.o. 3 times daily for chronic pain. 7.  Continue Protonix 40 mg p.o. twice daily for GERD symptoms. 8.  Continue Phenergan 25 mg p.o. every 6 hours as needed nausea. 9.    Start metoprolol extended release 25 mg p.o. daily for hypertension. 10.  Start lisinopril 10 mg p.o. daily for hypertension. 11.  Obtain collateral information from her psychiatrist and primary care physician. 12.  Disposition planning-in progress Sharma Covert, MD 12/24/2018, 9:51 AM

## 2018-12-24 NOTE — [facility] Counselor (Signed)
CSW met with patient to discuss patient's living arrangements following discharge.  Patient reports that she does not have anywhere to stay and has been told that she can not return to the home she has been living in, with her daughter and her fiance.   CSW discussed with the patient the [place] and shelters in [place].  Patient declined any shelter information at this time, stating that she was planning [place] the [place] with [place].  She reports that she will not leave her dogs and is declining the shelters at this time due to that reason.   CSW recommended that patient remain at a shelter.  Assunta Curtis, MSW, LCSW 12/24/2018 1:51 PM

## 2018-12-24 NOTE — Plan of Care (Signed)
  Problem: Self-Concept: Goal: Level of anxiety will decrease Outcome: Progressing  Patient appears less anxious interacting in the dayroom with peers appropriately.
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
## 2018-12-25 LAB — BASIC METABOLIC PANEL
Anion gap: 9 (ref 5–15)
BUN: 9 mg/dL (ref 8–23)
CO2: 29 mmol/L (ref 22–32)
Calcium: 9 mg/dL (ref 8.9–10.3)
Chloride: 103 mmol/L (ref 98–111)
Creatinine, Ser: 0.56 mg/dL (ref 0.44–1.00)
GFR calc Af Amer: >60 mL/min (ref >60)
GFR calc non Af Amer: >60 mL/min (ref >60)
Glucose, Bld: 113 mg/dL — ABNORMAL HIGH (ref 70–99)
Potassium: 3.8 mmol/L (ref 3.5–5.1)
Sodium: 141 mmol/L (ref 135–145)
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
## 2018-12-25 NOTE — Plan of Care (Signed)
Patient denies SI/HI/AVH. Patient has some anxiety about what is next for her when she leaves here.   Problem: Education: Goal: Emotional status will improve Outcome: Progressing Goal: Mental status will improve Outcome: Progressing

## 2018-12-25 NOTE — [facility] Group Notes (Signed)

## 2018-12-25 NOTE — [facility] Group Notes (Signed)
[facility] Group Notes:  (Nursing/MHT/Case Management/Adjunct)  Date:  12/25/2018  Time:  9:24 PM  Type of Therapy:  Group Therapy  Participation Level:  Active  Participation Quality:  Supportive  Affect:  Appropriate  Cognitive:  Alert  Insight:  Good  Engagement in Group:  Engaged  Modes of Intervention:  Support  Summary of Progress/Problems:  Jessica Larson 12/25/2018, 9:24 PM

## 2018-12-25 NOTE — Progress Notes (Signed)
D - Patient was in the day room upon arrival to the unit. Patient denies SI/HI/AVH. Patient endorses anxiety and depression stating, "I am just not sure where I am going when I get out of here." Patient given support and encouragement.   A - Patient compliant with medication administration per MD orders and procedures on the unit. Patient given education. Patient informed to let staff know if there are any issues or problems on the unit.   R - Patient being monitored Q 15 minutes for safety per unit protocol. Patient remains safe on the unit.

## 2018-12-25 NOTE — Plan of Care (Signed)
Patient is so worried about her dogs.Anxious and tearful most of the time,looking forward for the next dose of Xanax.Patient is trying to find a place for her to live.Stated that she does not want to go to a shelter with her dogs.Denies SI,HI and AVH.Compliant with medications.Appetite and energy level good.Support and encouragement given.

## 2018-12-25 NOTE — Progress Notes (Signed)
[facility] MD Progress Note  12/25/2018 11:47 AM Jessica Larson  MRN:  [id number] Subjective: Patient seen and chart reviewed.  Follow-up for this elderly woman who came into the hospital after a falling out with her daughter.  On interview today her chief complaint continues to be mainly not having a settled place to live.  She indicates to me that she would like to be discharged so that she could get the assistance of a police officer to go to visit her daughter so that she could get her purse and her belongings back.  The part about a police officer I think was put into her head by whoever saw her this weekend.  In any case she then says that she [place]the woods" until her [place].  I pointed out to her that this was clearly a poorly thought out an unsafe plan.  She says she wants to do it because she needs to take care of her dogs.  Patient denies being suicidal.  Does not appear to be psychotic.  Seems to have not very good judgment and be a little bit impulsive.  Has not been aggressive or threatening here on the unit.  Blood pressure is been still high and medicines have been adjusted slightly.  Her blood pressure today is still elevated but better than it was. Principal Problem: Adjustment disorder with mixed disturbance of emotions and conduct Diagnosis: Principal Problem:   Adjustment disorder with mixed disturbance of emotions and conduct Active Problems:   Anxiety  Total Time spent with patient: 30 minutes  Past Psychiatric History: Past history of chronic anxiety  Past Medical History:  Past Medical History:  Diagnosis Date  . Abnormal gait   . 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 Anxiety   . Atypical squamous cells of undetermined significance (ASCUS) on Papanicolaou smear of cervix   . Cancer ([place])   . Chronic back pain   . Chronic prescription benzodiazepine use   . Colon cancer ([place])   . High cholesterol   . History of loop recorder   . History of shingles   . Hypertension    . Osteoporosis   . Tremor     Past Surgical History:  Procedure Laterality Date  . COLON SURGERY     Family History: History reviewed. No pertinent family history. Family Psychiatric  History: See previous Social History:  Social History   Substance and Sexual Activity  Alcohol Use Not Currently  . Frequency: Never     Social History   Substance and Sexual Activity  Drug Use Never    Social History   Socioeconomic History  . Marital status: Divorced    Spouse name: Not on file  . Number of children: Not on file  . Years of education: Not on file  . Highest education level: Not on file  Occupational History  . Not on file  Social Needs  . Financial resource strain: Not on file  . Food insecurity    Worry: Not on file    Inability: Not on file  . Transportation needs    Medical: Not on file    Non-medical: Not on file  Tobacco Use  . Smoking status: Former Smoker    Years: 0.00    Quit date: 09/15/2012    Years since quitting: 6.2  . Smokeless tobacco: Never Used  Substance and Sexual Activity  . Alcohol use: Not Currently    Frequency: Never  . Drug use: Never  .  Sexual activity: Not on file  Lifestyle  . Physical activity    Days per week: Not on file    Minutes per session: Not on file  . Stress: Not on file  Relationships  . Social Herbalist on phone: Not on file    Gets together: Not on file    Attends religious service: Not on file    Active member of club or organization: Not on file    Attends meetings of clubs or organizations: Not on file    Relationship status: Not on file  Other Topics Concern  . Not on file  Social History Narrative   Lives at home with daughter, daughter's partner, two grandchildren.   Additional Social History:    Pain Medications: see PTA Prescriptions: see PTA Over the Counter: see PTA                    Sleep: Fair  Appetite:  Fair  Current Medications: Current Facility-Administered  Medications  Medication Dose Route Frequency Provider Last Rate Last Dose  . acetaminophen (TYLENOL) tablet 650 mg  650 mg Oral Q6H PRN Caroline Sauger, NP   650 mg at 12/22/18 2141  . ALPRAZolam Duanne Moron) tablet 1 mg  1 mg Oral Q6H PRN Stephine Langbehn, Madie Reno, MD   1 mg at 12/25/18 0815  . alum & mag hydroxide-simeth (MAALOX/MYLANTA) 200-200-20 MG/5ML suspension 30 mL  30 mL Oral Q4H PRN Caroline Sauger, NP      . amitriptyline (ELAVIL) tablet 50 mg  50 mg Oral QHS Caroline Sauger, NP   50 mg at 12/24/18 2121  . atorvastatin (LIPITOR) tablet 40 mg  40 mg Oral q1800 Tiyana Galla, Madie Reno, MD   40 mg at 12/24/18 1706  . cyclobenzaprine (FLEXERIL) tablet 10 mg  10 mg Oral Daily PRN Caroline Sauger, NP   10 mg at 12/24/18 2130  . fluticasone (FLONASE) 50 MCG/ACT nasal spray 1 spray  1 spray Each Nare Daily PRN Caroline Sauger, NP      . gabapentin (NEURONTIN) capsule 800 mg  800 mg Oral TID Caroline Sauger, NP   800 mg at 12/25/18 1142  . lisinopril (ZESTRIL) tablet 10 mg  10 mg Oral Daily Sharma Covert, MD   10 mg at 12/25/18 [id number]  . magnesium hydroxide (MILK OF MAGNESIA) suspension 30 mL  30 mL Oral Daily PRN Caroline Sauger, NP      . menthol-cetylpyridinium (CEPACOL) lozenge 3 mg  1 lozenge Oral PRN Adaeze Better, Madie Reno, MD   3 mg at 12/21/18 1618  . metoprolol succinate (TOPROL-XL) 24 hr tablet 25 mg  25 mg Oral Daily Sharma Covert, MD   25 mg at 12/25/18 [id number]  . pantoprazole (PROTONIX) EC tablet 40 mg  40 mg Oral BID PRN Caroline Sauger, NP      . promethazine (PHENERGAN) tablet 25 mg  25 mg Oral Q6H PRN Addylin Manke, Madie Reno, MD        Lab Results:  Results for orders placed or performed during the hospital encounter of 12/20/18 (from the past 48 hour(s))  Basic metabolic panel     Status: Abnormal   Collection Time: 12/25/18  6:36 AM  Result Value Ref Range   Sodium 141 135 - 145 mmol/L   Potassium 3.8 3.5 - 5.1 mmol/L   Chloride 103 98 - 111 mmol/L   CO2 29 22 -  32 mmol/L   Glucose, Bld 113 (H) 70 - 99 mg/dL   BUN  9 8 - 23 mg/dL   Creatinine, Ser 0.56 0.44 - 1.00 mg/dL   Calcium 9.0 8.9 - 10.3 mg/dL   GFR calc non Af Amer >60 >60 mL/min   GFR calc Af Amer >60 >60 mL/min   Anion gap 9 5 - 15    Comment: Performed at [facility], [address] [place], [place] [address]    Blood Alcohol level:  Lab Results  Component Value Date   [facility] <10 [id number]    Metabolic Disorder Labs: No results found for: HGBA1C, MPG No results found for: PROLACTIN No results found for: CHOL, TRIG, HDL, CHOLHDL, VLDL, LDLCALC  Physical Findings: AIMS:  , ,  ,  ,    CIWA:    COWS:     Musculoskeletal: Strength & Muscle Tone: within normal limits Gait & Station: normal Patient leans: N/A  Psychiatric Specialty Exam: Physical Exam  Nursing note and vitals reviewed. Constitutional: She appears well-developed and well-nourished.  HENT:  Head: Normocephalic and atraumatic.  Eyes: Pupils are equal, round, and reactive to light. Conjunctivae are normal.  Neck: Normal range of motion.  Cardiovascular: Regular rhythm and normal heart sounds.  Respiratory: Effort normal. No respiratory distress.  GI: Soft.  Musculoskeletal: Normal range of motion.  Neurological: She is alert.  Skin: Skin is warm and dry.  Psychiatric: Her speech is normal and behavior is normal. Thought content normal. Her mood appears anxious. Cognition and memory are impaired. She expresses impulsivity.    Review of Systems  Constitutional: Negative.   HENT: Negative.   Eyes: Negative.   Respiratory: Negative.   Cardiovascular: Negative.   Gastrointestinal: Negative.   Musculoskeletal: Negative.   Skin: Negative.   Neurological: Negative.   Psychiatric/Behavioral: Negative for depression, hallucinations, memory loss, substance abuse and suicidal ideas. The patient is nervous/anxious. The patient does not have insomnia.     Blood pressure (!) 146/75, pulse 68, temperature  97.8 F (36.6 C), temperature source Oral, resp. rate 18, height 5\' 4"  (1.626 m), weight 64.4 kg, SpO2 99 %.Body mass index is 24.37 kg/m.  General Appearance: Casual  Eye Contact:  Fair  Speech:  Clear and Coherent  Volume:  Normal  Mood:  Euthymic  Affect:  Congruent  Thought Process:  Goal Directed  Orientation:  Full (Time, Place, and Person)  Thought Content:  Logical  Suicidal Thoughts:  No  Homicidal Thoughts:  No  Memory:  Immediate;   Fair Recent;   Fair Remote;   Fair  Judgement:  Fair  Insight:  Fair  Psychomotor Activity:  Normal  Concentration:  Concentration: Fair  Recall:  [employer] of Knowledge:  Fair  Language:  Fair  Akathisia:  No  Handed:  Right  AIMS (if indicated):     Assets:  Desire for Improvement Resilience  ADL's:  Intact  Cognition:  WNL  Sleep:  Number of Hours: 6.5     Treatment Plan Summary: Daily contact with patient to assess and evaluate symptoms and progress in treatment, Medication management and Plan No indication to make any acute changes in medicine.  Spoke with her about how she really needs to stay someplace indoors and safe.  Her next check will be available on Thursday reportedly so I do not think we can safely discharge her before then.  We will keep working on her plan to get her purse back.  See if maybe we can get in touch with the daughter if she will allow it.  Continue individual and  group counseling and monitoring no change in medicine for now.  Alethia Berthold, MD 12/25/2018, 11:47 AM
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
## 2018-12-26 MED ORDER — LISINOPRIL 20 MG PO TABS
20.0000 mg | ORAL_TABLET | Freq: Every day | ORAL | Status: DC
  Administered 2018-12-27 – 2018-12-29 (×3): 20 mg via ORAL
  Filled 2018-12-26 (×3): qty 1

## 2018-12-26 NOTE — Tx Team (Signed)
Interdisciplinary Treatment and Diagnostic Plan Update  12/26/2018 Time of Session: 830am Jeanny Madaris MRN: [id number]  Principal Diagnosis: Adjustment disorder with mixed disturbance of emotions and conduct  Secondary Diagnoses: Principal Problem:   Adjustment disorder with mixed disturbance of emotions and conduct Active Problems:   Anxiety   Current Medications:  Current Facility-Administered Medications  Medication Dose Route Frequency Provider Last Rate Last Dose  . acetaminophen (TYLENOL) tablet 650 mg  650 mg Oral Q6H PRN Caroline Sauger, NP   650 mg at 12/26/18 0805  . ALPRAZolam Duanne Moron) tablet 1 mg  1 mg Oral Q6H PRN Clapacs, Madie Reno, MD   1 mg at 12/26/18 0805  . alum & mag hydroxide-simeth (MAALOX/MYLANTA) 200-200-20 MG/5ML suspension 30 mL  30 mL Oral Q4H PRN Caroline Sauger, NP      . amitriptyline (ELAVIL) tablet 50 mg  50 mg Oral QHS Caroline Sauger, NP   50 mg at 12/25/18 2127  . atorvastatin (LIPITOR) tablet 40 mg  40 mg Oral q1800 Clapacs, Madie Reno, MD   40 mg at 12/25/18 1703  . cyclobenzaprine (FLEXERIL) tablet 10 mg  10 mg Oral Daily PRN Caroline Sauger, NP   10 mg at 12/25/18 2128  . fluticasone (FLONASE) 50 MCG/ACT nasal spray 1 spray  1 spray Each Nare Daily PRN Caroline Sauger, NP      . gabapentin (NEURONTIN) capsule 800 mg  800 mg Oral TID Caroline Sauger, NP   800 mg at 12/26/18 0750  . lisinopril (ZESTRIL) tablet 10 mg  10 mg Oral Daily Sharma Covert, MD   10 mg at 12/26/18 0750  . magnesium hydroxide (MILK OF MAGNESIA) suspension 30 mL  30 mL Oral Daily PRN Caroline Sauger, NP      . menthol-cetylpyridinium (CEPACOL) lozenge 3 mg  1 lozenge Oral PRN Clapacs, Madie Reno, MD   3 mg at 12/21/18 1618  . metoprolol succinate (TOPROL-XL) 24 hr tablet 25 mg  25 mg Oral Daily Sharma Covert, MD   25 mg at 12/26/18 0750  . pantoprazole (PROTONIX) EC tablet 40 mg  40 mg Oral BID PRN Caroline Sauger, NP      . promethazine  (PHENERGAN) tablet 25 mg  25 mg Oral Q6H PRN Clapacs, Madie Reno, MD       PTA Medications: Medications Prior to Admission  Medication Sig Dispense Refill Last Dose  . alprazolam (XANAX) 2 MG tablet Take 2 mg by mouth 4 (four) times daily.     Marland Kitchen amitriptyline (ELAVIL) 50 MG tablet Take 50 mg by mouth at bedtime.     Marland Kitchen atenolol (TENORMIN) 50 MG tablet Take 50 mg by mouth 2 (two) times daily.     . Cholecalciferol (VITAMIN D3) 5000 units TBDP Take 1 tablet by mouth daily.      . cyclobenzaprine (FLEXERIL) 10 MG tablet Take 10 mg by mouth daily as needed for muscle spasms.      . fluticasone (FLONASE) 50 MCG/ACT nasal spray Place 1 spray into both nostrils daily as needed for allergies.      Marland Kitchen gabapentin (NEURONTIN) 800 MG tablet Take 800 mg by mouth 3 (three) times daily.      Marland Kitchen HYDROcodone-acetaminophen (NORCO/VICODIN) 5-325 MG tablet Take 1 tablet by mouth every 6 (six) hours as needed for moderate pain. 12 tablet 0   . pantoprazole (PROTONIX) 40 MG tablet Take 40 mg by mouth 2 (two) times daily as needed (indigestion).      . promethazine (PHENERGAN) 25 MG tablet Take  25 mg by mouth every 8 (eight) hours as needed for nausea or vomiting.        Patient Stressors: Financial difficulties Marital or family conflict  Patient Strengths: Ability for Engineer, manufacturing fund of knowledge Motivation for treatment/growth  Treatment Modalities: Medication Management, Group therapy, Case management,  1 to 1 session with clinician, Psychoeducation, Recreational therapy.   Physician Treatment Plan for Primary Diagnosis: Adjustment disorder with mixed disturbance of emotions and conduct Long Term Goal(s): Improvement in symptoms so as ready for discharge Improvement in symptoms so as ready for discharge   Short Term Goals: Ability to verbalize feelings will improve Ability to demonstrate self-control will improve Ability to identify triggers associated with substance abuse/mental  health issues will improve  Medication Management: Evaluate patient's response, side effects, and tolerance of medication regimen.  Therapeutic Interventions: 1 to 1 sessions, Unit Group sessions and Medication administration.  Evaluation of Outcomes: Progressing  Physician Treatment Plan for Secondary Diagnosis: Principal Problem:   Adjustment disorder with mixed disturbance of emotions and conduct Active Problems:   Anxiety  Long Term Goal(s): Improvement in symptoms so as ready for discharge Improvement in symptoms so as ready for discharge   Short Term Goals: Ability to verbalize feelings will improve Ability to demonstrate self-control will improve Ability to identify triggers associated with substance abuse/mental health issues will improve     Medication Management: Evaluate patient's response, side effects, and tolerance of medication regimen.  Therapeutic Interventions: 1 to 1 sessions, Unit Group sessions and Medication administration.  Evaluation of Outcomes: Progressing   RN Treatment Plan for Primary Diagnosis: Adjustment disorder with mixed disturbance of emotions and conduct Long Term Goal(s): Knowledge of disease and therapeutic regimen to maintain health will improve  Short Term Goals: Ability to verbalize feelings will improve, Ability to disclose and discuss suicidal ideas, Ability to identify and develop effective coping behaviors will improve and Compliance with prescribed medications will improve  Medication Management: RN will administer medications as ordered by provider, will assess and evaluate patient's response and provide education to patient for prescribed medication. RN will report any adverse and/or side effects to prescribing provider.  Therapeutic Interventions: 1 on 1 counseling sessions, Psychoeducation, Medication administration, Evaluate responses to treatment, Monitor vital signs and CBGs as ordered, Perform/monitor CIWA, COWS, AIMS and Fall  Risk screenings as ordered, Perform wound care treatments as ordered.  Evaluation of Outcomes: Progressing   LCSW Treatment Plan for Primary Diagnosis: Adjustment disorder with mixed disturbance of emotions and conduct Long Term Goal(s): Safe transition to appropriate next level of care at discharge, Engage patient in therapeutic group addressing interpersonal concerns.  Short Term Goals: Engage patient in aftercare planning with referrals and resources and Increase skills for wellness and recovery  Therapeutic Interventions: Assess for all discharge needs, 1 to 1 time with Social worker, Explore available resources and support systems, Assess for adequacy in community support network, Educate family and significant other(s) on suicide prevention, Complete Psychosocial Assessment, Interpersonal group therapy.  Evaluation of Outcomes: Progressing   Progress in Treatment: Attending groups: No. Participating in groups: No. Taking medication as prescribed: Yes. Toleration medication: Yes. Family/Significant other contact made: No, will contact:  when pt gives consent Patient understands diagnosis: Yes. Discussing patient identified problems/goals with staff: Yes. Medical problems stabilized or resolved: No. Denies suicidal/homicidal ideation: Yes. Issues/concerns per patient self-inventory: No. Other: NA  New problem(s) identified: No, Describe:  None reported  New Short Term/Long Term Goal(s):Attend outpatient treatment, take medication as prescribed, develop  and implement healthy coping methods  Patient Goals:  I just want things to be truthful  Discharge Plan or Barriers: Pt will return home and follow up with outpatient treatment. Update 12/26/18: Pt has an appointment scheduled with Dr. Zorita Pang on 01/16/19 at 4pm.  Reason for Continuation of Hospitalization: Medication stabilization  Estimated Length of Stay: 1-2 days  Recreational Therapy: Patient Stressors: N/A Patient  Goal: Patient will engage in groups without prompting or encouragement from LRT x3 group sessions within 5 recreation therapy group sessions   Attendees: Patient: 12/26/2018 10:41 AM  Physician: Alethia Berthold 12/26/2018 10:41 AM  Nursing:  12/26/2018 10:41 AM  RN Care Manager: 12/26/2018 10:41 AM  Social Worker:  Minette Brine Karin Pinedo 12/26/2018 10:41 AM  Recreational Therapist:  12/26/2018 10:41 AM  Other:  12/26/2018 10:41 AM  Other:  12/26/2018 10:41 AM  Other: 12/26/2018 10:41 AM    Scribe for Treatment Team: Mariann Laster Riannah Stagner, LCSW 12/26/2018 10:41 AM

## 2018-12-26 NOTE — Progress Notes (Signed)
[facility] MD Progress Note  12/26/2018 1:12 PM Jessica Larson  MRN:  [id number] Subjective: Patient seen chart reviewed.  Follow-up for this woman who is in the hospital with some anxious adjustment disorder symptoms.  On interview today the patient still feels nervous about her living situation.  Nothing much has changed since yesterday.  She still has not spoken to her daughter on the telephone and still maintains that her goal is to get her dogs back and then find a place that she can live together with her dogs.  Patient is not depressed not suicidal not psychotic.  She does get a little bit tearful in conversation but it is appropriate to the topic.  There is no evidence of suicidality.  Does not appear to be overusing or be oversedated on the alprazolam but also not in any withdrawal.  She did give me a telephone number of a person she identified as a Product/process development scientist from [employer].  I called this person, Ronney Lion, and it was a number at the [employer] division of [place] services.  I left a message.  Blood pressure still slightly high. Principal Problem: Adjustment disorder with mixed disturbance of emotions and conduct Diagnosis: Principal Problem:   Adjustment disorder with mixed disturbance of emotions and conduct Active Problems:   Anxiety  Total Time spent with patient: 30 minutes  Past Psychiatric History: Longstanding treatment with alprazolam.  See previous notes  Past Medical History:  Past Medical History:  Diagnosis Date  . Abnormal gait   . 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 Anxiety   . Atypical squamous cells of undetermined significance (ASCUS) on Papanicolaou smear of cervix   . Cancer ([place])   . Chronic back pain   . Chronic prescription benzodiazepine use   . Colon cancer ([place])   . High cholesterol   . History of loop recorder   . History of shingles   . Hypertension   . Osteoporosis   . Tremor     Past Surgical History:  Procedure Laterality  Date  . COLON SURGERY     Family History: History reviewed. No pertinent family history. Family Psychiatric  History: See previous.  Patient accuses her daughter of having a drug abuse problem Social History:  Social History   Substance and Sexual Activity  Alcohol Use Not Currently  . Frequency: Never     Social History   Substance and Sexual Activity  Drug Use Never    Social History   Socioeconomic History  . Marital status: Divorced    Spouse name: Not on file  . Number of children: Not on file  . Years of education: Not on file  . Highest education level: Not on file  Occupational History  . Not on file  Social Needs  . Financial resource strain: Not on file  . Food insecurity    Worry: Not on file    Inability: Not on file  . Transportation needs    Medical: Not on file    Non-medical: Not on file  Tobacco Use  . Smoking status: Former Smoker    Years: 0.00    Quit date: 09/15/2012    Years since quitting: 6.2  . Smokeless tobacco: Never Used  Substance and Sexual Activity  . Alcohol use: Not Currently    Frequency: Never  . Drug use: Never  . Sexual activity: Not on file  Lifestyle  . Physical activity    Days per week: Not on file  Minutes per session: Not on file  . Stress: Not on file  Relationships  . Social Herbalist on phone: Not on file    Gets together: Not on file    Attends religious service: Not on file    Active member of club or organization: Not on file    Attends meetings of clubs or organizations: Not on file    Relationship status: Not on file  Other Topics Concern  . Not on file  Social History Narrative   Lives at home with daughter, daughter's partner, two grandchildren.   Additional Social History:    Pain Medications: see PTA Prescriptions: see PTA Over the Counter: see PTA                    Sleep: Fair  Appetite:  Fair  Current Medications: Current Facility-Administered Medications   Medication Dose Route Frequency Provider Last Rate Last Dose  . acetaminophen (TYLENOL) tablet 650 mg  650 mg Oral Q6H PRN Caroline Sauger, NP   650 mg at 12/26/18 0805  . ALPRAZolam Duanne Moron) tablet 1 mg  1 mg Oral Q6H PRN Mikita Lesmeister, Madie Reno, MD   1 mg at 12/26/18 0805  . alum & mag hydroxide-simeth (MAALOX/MYLANTA) 200-200-20 MG/5ML suspension 30 mL  30 mL Oral Q4H PRN Caroline Sauger, NP      . amitriptyline (ELAVIL) tablet 50 mg  50 mg Oral QHS Caroline Sauger, NP   50 mg at 12/25/18 2127  . atorvastatin (LIPITOR) tablet 40 mg  40 mg Oral q1800 Jarmaine Ehrler, Madie Reno, MD   40 mg at 12/25/18 1703  . cyclobenzaprine (FLEXERIL) tablet 10 mg  10 mg Oral Daily PRN Caroline Sauger, NP   10 mg at 12/25/18 2128  . fluticasone (FLONASE) 50 MCG/ACT nasal spray 1 spray  1 spray Each Nare Daily PRN Caroline Sauger, NP      . gabapentin (NEURONTIN) capsule 800 mg  800 mg Oral TID Caroline Sauger, NP   800 mg at 12/26/18 0750  . [START ON 12/27/2018] lisinopril (ZESTRIL) tablet 20 mg  20 mg Oral Daily Lucilia Yanni T, MD      . magnesium hydroxide (MILK OF MAGNESIA) suspension 30 mL  30 mL Oral Daily PRN Caroline Sauger, NP      . menthol-cetylpyridinium (CEPACOL) lozenge 3 mg  1 lozenge Oral PRN Shahir Karen, Madie Reno, MD   3 mg at 12/21/18 1618  . metoprolol succinate (TOPROL-XL) 24 hr tablet 25 mg  25 mg Oral Daily Sharma Covert, MD   25 mg at 12/26/18 0750  . pantoprazole (PROTONIX) EC tablet 40 mg  40 mg Oral BID PRN Caroline Sauger, NP      . promethazine (PHENERGAN) tablet 25 mg  25 mg Oral Q6H PRN Stephan Draughn, Madie Reno, MD        Lab Results:  Results for orders placed or performed during the hospital encounter of 12/20/18 (from the past 48 hour(s))  Basic metabolic panel     Status: Abnormal   Collection Time: 12/25/18  6:36 AM  Result Value Ref Range   Sodium 141 135 - 145 mmol/L   Potassium 3.8 3.5 - 5.1 mmol/L   Chloride 103 98 - 111 mmol/L   CO2 29 22 - 32 mmol/L    Glucose, Bld 113 (H) 70 - 99 mg/dL   BUN 9 8 - 23 mg/dL   Creatinine, Ser 0.56 0.44 - 1.00 mg/dL   Calcium 9.0 8.9 - 10.3 mg/dL  GFR calc non Af Amer >60 >60 mL/min   GFR calc Af Amer >60 >60 mL/min   Anion gap 9 5 - 15    Comment: Performed at [facility], [address] [place], [place] [address]    Blood Alcohol level:  Lab Results  Component Value Date   [facility] <10 [id number]    Metabolic Disorder Labs: No results found for: HGBA1C, MPG No results found for: PROLACTIN No results found for: CHOL, TRIG, HDL, CHOLHDL, VLDL, LDLCALC  Physical Findings: AIMS:  , ,  ,  ,    CIWA:    COWS:     Musculoskeletal: Strength & Muscle Tone: within normal limits Gait & Station: normal Patient leans: N/A  Psychiatric Specialty Exam: Physical Exam  Nursing note and vitals reviewed. Constitutional: She appears well-developed and well-nourished.  HENT:  Head: Normocephalic and atraumatic.  Eyes: Pupils are equal, round, and reactive to light. Conjunctivae are normal.  Neck: Normal range of motion.  Cardiovascular: Regular rhythm and normal heart sounds.  Respiratory: Effort normal. No respiratory distress.  GI: Soft.  Musculoskeletal: Normal range of motion.  Neurological: She is alert.  Skin: Skin is warm and dry.  Psychiatric: She has a normal mood and affect. Her speech is normal and behavior is normal. Judgment and thought content normal. Cognition and memory are normal.    Review of Systems  Constitutional: Negative.   HENT: Negative.   Eyes: Negative.   Respiratory: Negative.   Cardiovascular: Negative.   Gastrointestinal: Negative.   Musculoskeletal: Negative.   Skin: Negative.   Neurological: Negative.   Psychiatric/Behavioral: Negative for depression, hallucinations, memory loss, substance abuse and suicidal ideas. The patient is nervous/anxious. The patient does not have insomnia.     Blood pressure (!) 149/74, pulse 70, temperature 98 F (36.7 C),  temperature source Oral, resp. rate 18, height 5\' 4"  (1.626 m), weight 64.4 kg, SpO2 100 %.Body mass index is 24.37 kg/m.  General Appearance: Casual  Eye Contact:  Fair  Speech:  Clear and Coherent  Volume:  Normal  Mood:  Euthymic  Affect:  Congruent  Thought Process:  Goal Directed  Orientation:  Full (Time, Place, and Person)  Thought Content:  Logical  Suicidal Thoughts:  No  Homicidal Thoughts:  No  Memory:  Immediate;   Fair Recent;   Fair Remote;   Fair  Judgement:  Fair  Insight:  Fair  Psychomotor Activity:  Normal  Concentration:  Concentration: Fair  Recall:  [employer] of Knowledge:  Fair  Language:  Fair  Akathisia:  No  Handed:  Right  AIMS (if indicated):     Assets:  Desire for Improvement  ADL's:  Intact  Cognition:  WNL  Sleep:  Number of Hours: 7.75     Treatment Plan Summary: Daily contact with patient to assess and evaluate symptoms and progress in treatment, Medication management and Plan No change to any psychiatric medicine.  No indication of any need to make clinical changes.  Encourage patient to think positively but to aim for discharge planning soon.  Patient currently has no place to stay.  I left a message for the caseworker at [facility] hoping that could be of some benefit.  Reminded patient that she really does not need to be in a psychiatric ward and we hope that we can get her discharged by the end of the week.  I did increase her blood pressure dose of lisinopril from 10 to 20 mg.  Jenny Reichmann  Niesha Bame, MD 12/26/2018, 1:12 PM

## 2018-12-26 NOTE — [facility] Group Notes (Signed)
  LCSW Group Therapy Note  12/26/2018 12:22 PM   Type of Therapy/Topic:  Group Therapy:  Feelings about Diagnosis  Participation Level:  Did Not Attend   Description of Group:   This group will allow patients to explore their thoughts and feelings about diagnoses they have received. Patients will be guided to explore their level of understanding and acceptance of these diagnoses. Facilitator will encourage patients to process their thoughts and feelings about the reactions of others to their diagnosis and will guide patients in identifying ways to discuss their diagnosis with significant others in their lives. This group will be process-oriented, with patients participating in exploration of their own experiences, giving and receiving support, and processing challenge from other group members.   Therapeutic Goals: 1. Patient will demonstrate understanding of diagnosis as evidenced by identifying two or more symptoms of the disorder 2. Patient will be able to express two feelings regarding the diagnosis 3. Patient will demonstrate their ability to communicate their needs through discussion and/or role play  Summary of Patient Progress: x   Therapeutic Modalities:   Cognitive Behavioral Therapy Brief Therapy Feelings Identification    Evalina Field, MSW, LCSW Clinical Social Work 12/26/2018 12:22 PM

## 2018-12-26 NOTE — Plan of Care (Signed)
Patient said she is feeling good and is hopeful she will be ready to D/C this week.   Problem: Education: Goal: Emotional status will improve Outcome: Progressing Goal: Mental status will improve Outcome: Progressing

## 2018-12-26 NOTE — Progress Notes (Signed)
D - Patient was in the day room upon arrival to the unit. Patient denies SI/HI/AVH. Patient endorses anxiety and depression. Patient stated she is worried about her daughter and her bf hurting her dogs. Patient said she is hopeful she will be able to go on October 1st.   A - Patient compliant with medication administration per MD orders and procedures on the unit. Patient given education. Patient informed to let staff know if there are any issues or problems on the unit.   R - Patient being monitored Q 15 minutes for safety per unit protocol. Patient remains safe on the unit.

## 2018-12-26 NOTE — Progress Notes (Signed)
D: Patient stated slept good last night .Stated appetite good and energy level  Is normal. Stated on Depression scale 0 , hopeless 0 and anxiety 7  .( low 0-10) Patient aware of information received   and able to verbalize  understanding of education presented  and unit programing .  Emotional and mental  status improved. Voice of no safety concerns.  Encourage to work on [employer] anxiety   concerns and decision making . gh) Denies suicidal  homicidal ideations  .  No auditory hallucinations  No pain concerns . Appropriate ADL'S. Interacting with peers and staff.  Patient continue to come to nursing station  Like clock work  For her xanax . No participation with  Therapy  Continue to  Request for lower back pain  flexeril and tylenol.  Patient concern about her dogs  And going to the bank.   A: Encourage patient participation with unit programming . Instruction  Given on  Medication , verbalize understanding.  R: Voice no other concerns. Staff continue to monitor

## 2018-12-26 NOTE — Plan of Care (Signed)
Patient aware of information received   and able to verbalize  understanding of education presented  and unit programing .  Emotional and mental  status improved. Voice of no safety concerns.  Encourage to work on [employer] anxiety   concerns and decision making .    Problem: Education: Goal: Knowledge of [facility] Education information/materials will improve Outcome: Progressing Goal: Emotional status will improve Outcome: Progressing Goal: Mental status will improve Outcome: Progressing Goal: Verbalization of understanding the information provided will improve Outcome: Progressing   Problem: Health Behavior/Discharge Planning: Goal: Compliance with treatment plan for underlying cause of condition will improve Outcome: Progressing   Problem: Health Behavior/Discharge Planning: Goal: Compliance with treatment plan for underlying cause of condition will improve Outcome: Progressing   Problem: Safety: Goal: Periods of time without injury will increase Outcome: Progressing   Problem: Self-Concept: Goal: Level of anxiety will decrease Outcome: Progressing Goal: Ability to modify response to factors that promote anxiety will improve Outcome: Progressing

## 2018-12-27 MED ORDER — VITAMIN D3 125 MCG (5000 UT) PO TBDP: 1.0000 | ORAL_TABLET | Freq: Every day | ORAL | 1 refills | Status: AC

## 2018-12-27 MED ORDER — ALPRAZOLAM 1 MG PO TABS: 1.0000 mg | ORAL_TABLET | Freq: Four times a day (QID) | ORAL | 0 refills | Status: DC | PRN

## 2018-12-27 MED ORDER — FLUTICASONE PROPIONATE 50 MCG/ACT NA SUSP: 1.0000 | Freq: Every day | NASAL | 1 refills | Status: AC | PRN

## 2018-12-27 MED ORDER — AMITRIPTYLINE HCL 50 MG PO TABS: 50.0000 mg | ORAL_TABLET | Freq: Every day | ORAL | 1 refills | Status: DC

## 2018-12-27 MED ORDER — LISINOPRIL 20 MG PO TABS: 20.0000 mg | ORAL_TABLET | Freq: Every day | ORAL | 1 refills | Status: AC

## 2018-12-27 MED ORDER — ATORVASTATIN CALCIUM 40 MG PO TABS: 40.0000 mg | ORAL_TABLET | Freq: Every day | ORAL | 1 refills | Status: AC

## 2018-12-27 MED ORDER — PROMETHAZINE HCL 25 MG PO TABS: 25.0000 mg | ORAL_TABLET | Freq: Four times a day (QID) | ORAL | 1 refills | Status: DC | PRN

## 2018-12-27 MED ORDER — GABAPENTIN 400 MG PO CAPS: 800.0000 mg | ORAL_CAPSULE | Freq: Three times a day (TID) | ORAL | 1 refills | Status: DC

## 2018-12-27 MED ORDER — CYCLOBENZAPRINE HCL 10 MG PO TABS: 10.0000 mg | ORAL_TABLET | Freq: Every day | ORAL | 1 refills | Status: AC | PRN

## 2018-12-27 MED ORDER — PANTOPRAZOLE SODIUM 40 MG PO TBEC: 40.0000 mg | DELAYED_RELEASE_TABLET | Freq: Two times a day (BID) | ORAL | 1 refills | Status: AC | PRN

## 2018-12-27 NOTE — Progress Notes (Signed)
D: Patient stated slept good last night .Stated appetite  good and energy level   normal. Stated concentration good . Stated on Depression scale  0, hopeless 0 and anxiety 0 .( low 0-10 high) Denies suicidal  homicidal ideations  .  No auditory hallucinations  No pain concerns . Appropriate ADL'S. Interacting with peers and staff. Patient aware of information received and able to verbalize understanding of education presented and unit programing . Emotional and mental status improved. Voice of no safety concerns. Encourage to work on [employer] anxiety concerns and decision making . Limited insight into  Living situation.   A: Encourage patient participation with unit programming . Instruction  Given on  Medication , verbalize understanding.  R: Voice no other concerns. Staff continue to monitor

## 2018-12-27 NOTE — Progress Notes (Signed)
Recreation Therapy Notes  Date: 12/27/2018  Time: 9:30 am   Location: Craft room  Behavioral response: Appropriate   Intervention Topic: Emotions  Discussion/Intervention:  Group content on today was focused on emotions. The group identified what emotions are and why it is important to have emotions. Patients expressed some positive and negative emotions. Individuals gave some past experiences on how they normally dealt with emotions in the past. The group described some positive ways to deal with emotions in the future. Patients participated in the intervention "Name the Jerl Santos" where individuals were given a chance to experience different emotions.  Clinical Observations/Feedback:  Patient came to group and explained that emotions come from inside of you. She stated that when she is overwhelmed, she experiences many negative emotions. Participant also described having a loss of appetite when she is sad. Individual was social with peers and staff while participating in the intervention.    Lynett Brasil LRT/CTRS         Caral Whan 12/27/2018 11:01 AM

## 2018-12-27 NOTE — Progress Notes (Signed)
D - Patient was in the day room upon arrival to the unit. Patient denies SI/HI/AVH. Patient endorses anxiety and depression. Patient was very thankful to staff for taking care of her. Patient said she is ready to discharge tomorrow.   A - Patient compliant with medication administration per MD orders and procedures on the unit. Patient given education. Patient informed to let staff know if there are any issues or problems on the unit.   R - Patient being monitored Q 15 minutes for safety per unit protocol. Patient remains safe on the unit.

## 2018-12-27 NOTE — Progress Notes (Signed)
[facility] MD Progress Note  12/27/2018 4:48 PM Jessica Larson  MRN:  [id number] Subjective: Patient seen chart reviewed.  Patient was able to discuss in a calm manner today her plans for discharge.  We advised the patient today that we would be discharging her tomorrow.  This gives her some time in advance to make plans.  In addition she should have money available tomorrow as 1 of her check should come.  Patient is able to articulate a clear plan to take care of herself.  She is not showing symptoms of major depression.  There is no sign of her being suicidal or psychotic.  No new physical complaints. Principal Problem: Adjustment disorder with mixed disturbance of emotions and conduct Diagnosis: Principal Problem:   Adjustment disorder with mixed disturbance of emotions and conduct Active Problems:   Anxiety  Total Time spent with patient: 30 minutes  Past Psychiatric History: Patient has a past history of chronic anxiety disorder  Past Medical History:  Past Medical History:  Diagnosis Date  . Abnormal gait   . 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 Anxiety   . Atypical squamous cells of undetermined significance (ASCUS) on Papanicolaou smear of cervix   . Cancer ([place])   . Chronic back pain   . Chronic prescription benzodiazepine use   . Colon cancer ([place])   . High cholesterol   . History of loop recorder   . History of shingles   . Hypertension   . Osteoporosis   . Tremor     Past Surgical History:  Procedure Laterality Date  . COLON SURGERY     Family History: History reviewed. No pertinent family history. Family Psychiatric  History: See previous.  Allegations that the daughter has substance abuse problems Social History:  Social History   Substance and Sexual Activity  Alcohol Use Not Currently  . Frequency: Never     Social History   Substance and Sexual Activity  Drug Use Never    Social History   Socioeconomic History  . Marital status: Divorced    Spouse name: Not on  file  . Number of children: Not on file  . Years of education: Not on file  . Highest education level: Not on file  Occupational History  . Not on file  Social Needs  . Financial resource strain: Not on file  . Food insecurity    Worry: Not on file    Inability: Not on file  . Transportation needs    Medical: Not on file    Non-medical: Not on file  Tobacco Use  . Smoking status: Former Smoker    Years: 0.00    Quit date: 09/15/2012    Years since quitting: 6.2  . Smokeless tobacco: Never Used  Substance and Sexual Activity  . Alcohol use: Not Currently    Frequency: Never  . Drug use: Never  . Sexual activity: Not on file  Lifestyle  . Physical activity    Days per week: Not on file    Minutes per session: Not on file  . Stress: Not on file  Relationships  . Social Herbalist on phone: Not on file    Gets together: Not on file    Attends religious service: Not on file    Active member of club or organization: Not on file    Attends meetings of clubs or organizations: Not on file    Relationship status: Not on file  Other Topics Concern  . Not  on file  Social History Narrative   Lives at home with daughter, daughter's partner, two grandchildren.   Additional Social History:    Pain Medications: see PTA Prescriptions: see PTA Over the Counter: see PTA                    Sleep: Fair  Appetite:  Fair  Current Medications: Current Facility-Administered Medications  Medication Dose Route Frequency Provider Last Rate Last Dose  . acetaminophen (TYLENOL) tablet 650 mg  650 mg Oral Q6H PRN Caroline Sauger, NP   650 mg at 12/26/18 2125  . ALPRAZolam (XANAX) tablet 1 mg  1 mg Oral Q6H PRN , Madie Reno, MD   1 mg at 12/27/18 1400  . alum & mag hydroxide-simeth (MAALOX/MYLANTA) 200-200-20 MG/5ML suspension 30 mL  30 mL Oral Q4H PRN Caroline Sauger, NP      . amitriptyline (ELAVIL) tablet 50 mg  50 mg Oral QHS Caroline Sauger, NP   50  mg at 12/26/18 2126  . atorvastatin (LIPITOR) tablet 40 mg  40 mg Oral q1800 , Madie Reno, MD   40 mg at 12/26/18 1716  . cyclobenzaprine (FLEXERIL) tablet 10 mg  10 mg Oral Daily PRN Caroline Sauger, NP   10 mg at 12/26/18 1325  . fluticasone (FLONASE) 50 MCG/ACT nasal spray 1 spray  1 spray Each Nare Daily PRN Caroline Sauger, NP      . gabapentin (NEURONTIN) capsule 800 mg  800 mg Oral TID Caroline Sauger, NP   800 mg at 12/27/18 1155  . lisinopril (ZESTRIL) tablet 20 mg  20 mg Oral Daily , Madie Reno, MD   20 mg at 12/27/18 0757  . magnesium hydroxide (MILK OF MAGNESIA) suspension 30 mL  30 mL Oral Daily PRN Caroline Sauger, NP      . menthol-cetylpyridinium (CEPACOL) lozenge 3 mg  1 lozenge Oral PRN , Madie Reno, MD   3 mg at 12/21/18 1618  . metoprolol succinate (TOPROL-XL) 24 hr tablet 25 mg  25 mg Oral Daily Sharma Covert, MD   25 mg at 12/27/18 0757  . pantoprazole (PROTONIX) EC tablet 40 mg  40 mg Oral BID PRN Caroline Sauger, NP      . promethazine (PHENERGAN) tablet 25 mg  25 mg Oral Q6H PRN , Madie Reno, MD        Lab Results: No results found for this or any previous visit (from the past 48 hour(s)).  Blood Alcohol level:  Lab Results  Component Value Date   ETH <10 [id number]    Metabolic Disorder Labs: No results found for: HGBA1C, MPG No results found for: PROLACTIN No results found for: CHOL, TRIG, HDL, CHOLHDL, VLDL, LDLCALC  Physical Findings: AIMS:  , ,  ,  ,    CIWA:    COWS:     Musculoskeletal: Strength & Muscle Tone: within normal limits Gait & Station: normal Patient leans: N/A  Psychiatric Specialty Exam: Physical Exam  Nursing note and vitals reviewed. Constitutional: She appears well-developed and well-nourished.  HENT:  Head: Normocephalic and atraumatic.  Eyes: Pupils are equal, round, and reactive to light. Conjunctivae are normal.  Neck: Normal range of motion.  Cardiovascular: Regular rhythm and  normal heart sounds.  Respiratory: Effort normal.  GI: Soft.  Musculoskeletal: Normal range of motion.  Neurological: She is alert.  Skin: Skin is warm and dry.  Psychiatric: She has a normal mood and affect. Her behavior is normal. Judgment and thought content normal.  Review of Systems  Constitutional: Negative.   HENT: Negative.   Eyes: Negative.   Respiratory: Negative.   Cardiovascular: Negative.   Gastrointestinal: Negative.   Musculoskeletal: Negative.   Skin: Negative.   Neurological: Negative.   Psychiatric/Behavioral: Negative.     Blood pressure (!) 156/73, pulse 67, temperature 97.9 F (36.6 C), temperature source Oral, resp. rate 18, height 5\' 4"  (1.626 m), weight 64.4 kg, SpO2 100 %.Body mass index is 24.37 kg/m.  General Appearance: Casual  Eye Contact:  Good  Speech:  Clear and Coherent  Volume:  Normal  Mood:  Euthymic  Affect:  Congruent  Thought Process:  Goal Directed  Orientation:  Full (Time, Place, and Person)  Thought Content:  Logical  Suicidal Thoughts:  No  Homicidal Thoughts:  No  Memory:  Immediate;   Fair Recent;   Fair Remote;   Fair  Judgement:  Fair  Insight:  Fair  Psychomotor Activity:  Normal  Concentration:  Concentration: Fair  Recall:  [employer] of Knowledge:  Fair  Language:  Fair  Akathisia:  No  Handed:  Right  AIMS (if indicated):     Assets:  Desire for Improvement Physical Health Resilience  ADL's:  Intact  Cognition:  WNL  Sleep:  Number of Hours: 7.15     Treatment Plan Summary: Daily contact with patient to assess and evaluate symptoms and progress in treatment, Medication management and Plan No change to medications.  I will prepare prescriptions for her for discharge tomorrow.  Anticipate discharge relatively early in the day to allow her to get to the bank to start working on her situation.  Follow-up arrangements to be given at that time.  Alethia Berthold, MD 12/27/2018, 4:48 PM

## 2018-12-27 NOTE — Plan of Care (Signed)
Patient less tearful this evening and very appreciative of staff. Patient denies SI/HI/AVH   Problem: Education: Goal: Emotional status will improve Outcome: Progressing Goal: Mental status will improve Outcome: Progressing

## 2018-12-27 NOTE — BHH Suicide Risk Assessment (Signed)
[facility] Discharge Suicide Risk Assessment   Principal Problem: Adjustment disorder with mixed disturbance of emotions and conduct Discharge Diagnoses: Principal Problem:   Adjustment disorder with mixed disturbance of emotions and conduct Active Problems:   Anxiety   Total Time spent with patient: 30 minutes  Musculoskeletal: Strength & Muscle Tone: within normal limits Gait & Station: normal Patient leans: N/A  Psychiatric Specialty Exam: Review of Systems  Constitutional: Negative.   HENT: Negative.   Eyes: Negative.   Respiratory: Negative.   Cardiovascular: Negative.   Gastrointestinal: Negative.   Musculoskeletal: Negative.   Skin: Negative.   Neurological: Negative.   Psychiatric/Behavioral: Negative.     Blood pressure (!) 156/73, pulse 67, temperature 97.9 F (36.6 C), temperature source Oral, resp. rate 18, height 5\' 4"  (1.626 m), weight 64.4 kg, SpO2 100 %.Body mass index is 24.37 kg/m.  General Appearance: Casual  Eye Contact::  Good  Speech:  Clear and Coherent409  Volume:  Normal  Mood:  Euthymic  Affect:  Constricted  Thought Process:  Coherent  Orientation:  Full (Time, Place, and Person)  Thought Content:  Logical  Suicidal Thoughts:  No  Homicidal Thoughts:  No  Memory:  Immediate;   Fair Recent;   Fair Remote;   Fair  Judgement:  Fair  Insight:  Fair  Psychomotor Activity:  Normal  Concentration:  Fair  Recall:  [employer] of Knowledge:Fair  Language: Fair  Akathisia:  No  Handed:  Right  AIMS (if indicated):     Assets:  Desire for Improvement Physical Health Resilience  Sleep:  Number of Hours: 7.15  Cognition: WNL  ADL's:  Intact   Mental Status Per Nursing Assessment::   On Admission:  NA  Demographic Factors:  Caucasian and Living alone  Loss Factors: Loss of significant relationship and Financial problems/change in socioeconomic status  Historical Factors: NA  Risk Reduction Factors:   Sense of responsibility to family,  Religious beliefs about death and Positive therapeutic relationship  Continued Clinical Symptoms:  Dysthymia  Cognitive Features That Contribute To Risk:  None    Suicide Risk:  Minimal: No identifiable suicidal ideation.  Patients presenting with no risk factors but with morbid ruminations; may be classified as minimal risk based on the severity of the depressive symptoms  Follow-up [facility] Follow up on 01/16/2019.   Why: Please follow up with Dr. Zorita Pang on Tuesday, October 20th at 4:00pm. Contact information: [address] [place], [place] [address] Phone: [phone] Fax:           Plan Of Care/Follow-up recommendations:  Activity:  Activity as tolerated Diet:  Regular diet Other:  Follow-up with outpatient treatment with your usual doctor.  Alethia Berthold, MD 12/27/2018, 4:51 PM

## 2018-12-27 NOTE — Plan of Care (Signed)
Patient aware of information received   and able to verbalize  understanding of education presented  and unit programing .  Emotional and mental  status improved. Voice of no safety concerns.  Encourage to work on Radiographer, therapeutic anxiety   concerns and decision making    Problem: Education: Goal: Knowledge of [facility] Education information/materials will improve Outcome: Progressing Goal: Emotional status will improve Outcome: Progressing Goal: Mental status will improve Outcome: Progressing Goal: Verbalization of understanding the information provided will improve Outcome: Progressing   Problem: Health Behavior/Discharge Planning: Goal: Compliance with treatment plan for underlying cause of condition will improve Outcome: Progressing   Problem: Safety: Goal: Periods of time without injury will increase Outcome: Progressing    .

## 2018-12-27 NOTE — BHH Group Notes (Addendum)
LCSW Group Therapy Note  12/27/2018 1:00 PM  Type of Therapy/Topic:  Group Therapy:  Emotion Regulation  Participation Level:  Minimal   Description of Group:   The purpose of this group is to assist patients in learning to regulate negative emotions and experience positive emotions. Patients will be guided to discuss ways in which they have been vulnerable to their negative emotions. These vulnerabilities will be juxtaposed with experiences of positive emotions or situations, and patients will be challenged to use positive emotions to combat negative ones. Special emphasis will be placed on coping with negative emotions in conflict situations, and patients will process healthy conflict resolution skills.  Therapeutic Goals: 1. Patient will identify two positive emotions or experiences to reflect on in order to balance out negative emotions 2. Patient will label two or more emotions that they find the most difficult to experience 3. Patient will demonstrate positive conflict resolution skills through discussion and/or role plays  Summary of Patient Progress: Patient was present in group. Patient shared how she has struggled with establishing a healthy relationship with her daughter.  Patient shared that she plans to be discharged, have the police help her get her items and stay at a local hotel.  Patient became emotional discussing her relationship with her daughter and left group early.   Therapeutic Modalities:   Cognitive Behavioral Therapy Feelings Identification Dialectical Behavioral Therapy  Assunta Curtis, MSW, LCSW 12/27/2018 12:20 PM

## 2018-12-27 NOTE — Progress Notes (Signed)
CSW spoke with pt about discharge plan. Pt reports she is not going to a shelter because she wants to take her dogs with her. Pt reported she called [facility], but they currently have a waiting list so she cannot go there. Pt reported at discharge tomorrow, she would like transportation to get to [employer] on [address] to pick up her money and file a fraud claim.   Evalina Field, MSW, LCSW Clinical Social Work 12/27/2018 9:14 AM

## 2018-12-28 MED ORDER — ALPRAZOLAM 0.5 MG PO TABS
1.0000 mg | ORAL_TABLET | Freq: Once | ORAL | Status: AC
  Administered 2018-12-28: 1 mg via ORAL
  Filled 2018-12-28: qty 2

## 2018-12-28 NOTE — Progress Notes (Signed)
Recreation Therapy Notes  INPATIENT RECREATION TR PLAN  Patient Details Name: Jessica Larson MRN: [id number] DOB: May 17, 1955 Today's Date: 12/28/2018  Rec Therapy Plan Is patient appropriate for Therapeutic Recreation?: Yes Treatment times per week: at least 3 Estimated Length of Stay: 5-7 days TR Treatment/Interventions: Group participation (Comment)  Discharge Criteria Pt will be discharged from therapy if:: Discharged Treatment plan/goals/alternatives discussed and agreed upon by:: Patient/family  Discharge Summary Short term goals set: Patient will engage in groups without prompting or encouragement from LRT x3 group sessions within 5 recreation therapy group sessions Short term goals met: Adequate for discharge Progress toward goals comments: Groups attended Which groups?: Other (Comment)(Emotions, Team work) Reason goals not met: N/A Therapeutic equipment acquired: N/A Reason patient discharged from therapy: Discharge from hospital Pt/family agrees with progress & goals achieved: Yes Date patient discharged from therapy: 12/28/18   Arabela Basaldua 12/28/2018, 11:40 AM

## 2018-12-28 NOTE — B[facility] Group Notes (Signed)
Balance In Life 12/28/2018 1PM  Type of Therapy/Topic:  Group Therapy:  Balance in Life  Participation Level:  Did Not Attend  Description of Group:   This group will address the concept of balance and how it feels and looks when one is unbalanced. Patients will be encouraged to process areas in their lives that are out of balance and identify reasons for remaining unbalanced. Facilitators will guide patients in utilizing problem-solving interventions to address and correct the stressor making their life unbalanced. Understanding and applying boundaries will be explored and addressed for obtaining and maintaining a balanced life. Patients will be encouraged to explore ways to assertively make their unbalanced needs known to significant others in their lives, using other group members and facilitator for support and feedback.  Therapeutic Goals: 1. Patient will identify two or more emotions or situations they have that consume much of in their lives. 2. Patient will identify signs/triggers that life has become out of balance:  3. Patient will identify two ways to set boundaries in order to achieve balance in their lives:  4. Patient will demonstrate ability to communicate their needs through discussion and/or role plays  Summary of Patient Progress:    Therapeutic Modalities:   Cognitive Behavioral Therapy Solution-Focused Therapy Assertiveness Training  Desma Wilkowski Lynelle Smoke, LCSW

## 2018-12-28 NOTE — BHH Counselor (Signed)
CSW spoke with patient regarding her discharge plans.  Patient became overwhelmed and upset and was not able to tell what her plans were.  CSW stepped away from the patient to discuss with others members of treatment team to develop a plan and identify any resources or supports available to the patient.  CSW met again with patient who reports that she does not have supports.  Patient identified she has no supports locally.  Patient began to become tangential and discuss the conflictual relationship between she and her daughter.    CSW and CSW Darren met with the pt to review plan. Plan is for patient to have a taxi voucher to take her to the [employer], as requested by patient, then to the patient's home to obtain her belongings.  Patient reports that she would like local law enforcement present as she obtains her items from the home and her two dogs.  CSWs recommended that patient use her cell phone to contact local law enforcement is safety is a concern.  Patient was agreeable to use neighbors phone if necessary once recommended by CSW.  Patient later to nurse that she would not use the neighbors phone and CSW recommended that patient could use the phone in the leasing office possibly.    CSW again reviewed shelter options with the patient, however, patient declined shelter information.  CSW provided patient with list of affordable motels/hotels in [place]. Patient was encouraged to look through the list to identify where she would afford.    Patient shared that she does not have a phone and CSW informed patient about purchasing a tracfone and minutes.    Assunta Curtis, MSW, LCSW 12/28/2018 11:12 AM

## 2018-12-28 NOTE — Progress Notes (Signed)
Recreation Therapy Notes    Date: 12/28/2018  Time: 9:30 am    Location: Craft room   Behavioral response: N/A   Intervention Topic: Goals  Discussion/Intervention: Patient did not attend group.   Clinical Observations/Feedback:  Patient did not attend group.   Marguriete Wootan LRT/CTRS        Shlome Baldree 12/28/2018 11:09 AM

## 2018-12-28 NOTE — Plan of Care (Signed)
  Problem: Group Participation Goal: STG - Patient will engage in groups without prompting or encouragement from LRT x3 group sessions within 5 recreation therapy group sessions Description: STG - Patient will engage in groups without prompting or encouragement from LRT x3 group sessions within 5 recreation therapy group sessions 12/28/2018 1139 by Ernest Haber, LRT Outcome: Adequate for Discharge 12/28/2018 1138 by Ernest Haber, LRT Outcome: Adequate for Discharge

## 2018-12-28 NOTE — Plan of Care (Signed)
  Problem: Education: Goal: Knowledge of [facility] Education information/materials will improve Outcome: Adequate for Discharge Goal: Emotional status will improve Outcome: Adequate for Discharge Goal: Mental status will improve Outcome: Adequate for Discharge Goal: Verbalization of understanding the information provided will improve Outcome: Adequate for Discharge   Problem: Health Behavior/Discharge Planning: Goal: Compliance with treatment plan for underlying cause of condition will improve Outcome: Adequate for Discharge   Problem: Safety: Goal: Periods of time without injury will increase Outcome: Adequate for Discharge   Problem: Self-Concept: Goal: Level of anxiety will decrease Outcome: Adequate for Discharge Goal: Ability to modify response to factors that promote anxiety will improve Outcome: Adequate for Discharge

## 2018-12-28 NOTE — Progress Notes (Signed)
D: Patient is aware of  Discharge this shift .but was unable to leave due to time frame Periods of crying and tearfulness  During am shift . Unable to come up with plan to go home and collect her belongings from her daugthers apartment . Patient has not reached out to daughter since being on the unit. Encourage  Patient to call  Her letting her know she is coming to get her things . Patient wanted writer to call the hotel  To  Make reservations  Patient did  But later in shift patient became overwhelmed  Again tearful and crying  .   No unit programing  attended.  A: Encourage patient participation with unit programming . Instruction  Given on  Medication , verbalize understanding. R: Voice no other concerns. Staff continue to monitor

## 2018-12-28 NOTE — Progress Notes (Signed)
  [facility] Adult Case Management Discharge Plan :  Will you be returning to the same living situation after discharge:  No. Patient reports that she plans on staying at a motel. At discharge, do you have transportation home?: Yes,  CSW will provide the patient with cab voucher. Do you have the ability to pay for your medications: Yes,  [employer]  Release of information consent forms completed and in the chart;  Patient's signature needed at discharge.  Patient to Follow up at: Follow-up [facility] Follow up on 01/16/2019.   Why: Please follow up with Dr. Zorita Pang on Tuesday, October 20th at 4:00pm. Contact information: [address] [place], [place] [address] Phone: [phone] Fax:           Next level of care provider has access to [facility] and Suicide Prevention discussed: No.  Have you used any form of tobacco in the last 30 days? (Cigarettes, Smokeless Tobacco, Cigars, and/or Pipes): No  Has patient been referred to the Quitline?: N/A patient is not a smoker  Patient has been referred for addiction treatment: [place], LCSW 12/28/2018, 10:05 AM
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
## 2018-12-29 NOTE — Progress Notes (Signed)
Patient denies SI/HI, denies A/V hallucinations. Patient verbalizes understanding of discharge instructions, follow up care and prescriptions. Patient given all belongings from [facility] locker. Patient escorted out by staff, transported by cab.

## 2018-12-29 NOTE — Progress Notes (Signed)
  [facility] Adult Case Management Discharge Plan :  Will you be returning to the same living situation after discharge:  No. Patient reports that she plans on staying at a motel. At discharge, do you have transportation home?: Yes,  CSW will provide the patient with cab voucher. Do you have the ability to pay for your medications: Yes,  [employer] Medicare  Release of information consent forms completed and in the chart;    Patient to Follow up at: Follow-up [facility] Follow up on 01/16/2019.   Why: Please follow up with Dr. Zorita Pang on Tuesday, October 20th at 4:00pm. Contact information: [address] [place], [place] [address] Phone: [phone] Fax:           Next level of care provider has access to [place] and Suicide Prevention discussed: No.  Have you used any form of tobacco in the last 30 days? (Cigarettes, Smokeless Tobacco, Cigars, and/or Pipes): No  Has patient been referred to the Quitline?: N/A patient is not a smoker  Patient has been referred for addiction treatment: N/A   Pt was scheduled to be discharged yesterday 12/28/18, but was not due to attending nurse requesting taxi voucher be cancelled at 3:30pm on 12/28/2018 due to "not wanting pt to be discharged in the dark".  Mariann Laster Tameeka Luo, LCSW 12/29/2018, 9:11 AM

## 2018-12-29 NOTE — [facility] Counselor (Signed)
CSW notes pt was scheduled for discharge on 12/28/2018. However,  pt was not discharged when Nurse Gwen expressed concerns of the taxi taking too long and pt being discharged in the dark. CSW checked with psychiatrist who seconded the concern though was open to pursuing the discharge if cab was on the way.   CSW called Ladona Mow who were unable to provide a time frame. CSW canceled the cab for 12/28/2018 and scheduled for 12/29/2018 at [place], MSW, LCSW 12/28/2018 3:21 PM

## 2018-12-29 NOTE — Discharge Summary (Signed)
  Brief follow-up to discharge summary.  Patient did not leave the hospital yesterday because of delays in arranging for transportation.  Patient would not of been able to get to the bank and complete her necessary business and then continue her plan before dark.  No new developments no new complaints.  Reinstate discharge order we have a taxi scheduled for this morning at 10:00.  Patient aware and agreeable.

## 2018-12-29 NOTE — Progress Notes (Signed)
Patient is happy that she will be discharging from here today and expecting her daughter to be at home to receive her, patient is doing well and thankful for her quick recovery and denies si/hi/AVH also patient is extending her gratitude to the dr. And nurses. And hopes that she won't be coming back, support and education  is provided, 15 minutes safety rounding is maintained. No distress.

## 2019-01-31 DIAGNOSIS — F5101 Primary insomnia: Secondary | ICD-10-CM | POA: Insufficient documentation

## 2019-01-31 DIAGNOSIS — F4322 Adjustment disorder with anxiety: Secondary | ICD-10-CM | POA: Insufficient documentation

## 2019-02-01 ENCOUNTER — Emergency Department: Payer: Medicare HMO

## 2019-02-01 ENCOUNTER — Other Ambulatory Visit: Payer: Self-pay

## 2019-02-01 ENCOUNTER — Observation Stay
Admission: EM | Admit: 2019-02-01 | Discharge: 2019-02-05 | Disposition: A | Discharge status: Home / Self Care | Payer: Medicare HMO | Attending: Internal Medicine | Admitting: Internal Medicine

## 2019-02-01 DIAGNOSIS — F411 Generalized anxiety disorder: Secondary | ICD-10-CM | POA: Diagnosis not present

## 2019-02-01 DIAGNOSIS — Z85038 Personal history of other malignant neoplasm of large intestine: Secondary | ICD-10-CM | POA: Diagnosis not present

## 2019-02-01 DIAGNOSIS — Z7901 Long term (current) use of anticoagulants: Secondary | ICD-10-CM | POA: Diagnosis not present

## 2019-02-01 DIAGNOSIS — N390 Urinary tract infection, site not specified: Secondary | ICD-10-CM | POA: Diagnosis present

## 2019-02-01 DIAGNOSIS — F329 Major depressive disorder, single episode, unspecified: Secondary | ICD-10-CM | POA: Diagnosis not present

## 2019-02-01 DIAGNOSIS — K219 Gastro-esophageal reflux disease without esophagitis: Secondary | ICD-10-CM | POA: Insufficient documentation

## 2019-02-01 DIAGNOSIS — E785 Hyperlipidemia, unspecified: Secondary | ICD-10-CM | POA: Insufficient documentation

## 2019-02-01 DIAGNOSIS — F22 Delusional disorders: Secondary | ICD-10-CM | POA: Insufficient documentation

## 2019-02-01 DIAGNOSIS — G9341 Metabolic encephalopathy: Secondary | ICD-10-CM | POA: Insufficient documentation

## 2019-02-01 DIAGNOSIS — Z87891 Personal history of nicotine dependence: Secondary | ICD-10-CM | POA: Diagnosis not present

## 2019-02-01 DIAGNOSIS — A419 Sepsis, unspecified organism: Secondary | ICD-10-CM | POA: Diagnosis present

## 2019-02-01 DIAGNOSIS — E7800 Pure hypercholesterolemia, unspecified: Secondary | ICD-10-CM | POA: Insufficient documentation

## 2019-02-01 DIAGNOSIS — F1320 Sedative, hypnotic or anxiolytic dependence, uncomplicated: Secondary | ICD-10-CM | POA: Diagnosis not present

## 2019-02-01 DIAGNOSIS — E860 Dehydration: Secondary | ICD-10-CM | POA: Diagnosis not present

## 2019-02-01 DIAGNOSIS — Z20828 Contact with and (suspected) exposure to other viral communicable diseases: Secondary | ICD-10-CM | POA: Insufficient documentation

## 2019-02-01 DIAGNOSIS — B9620 Unspecified Escherichia coli [E. coli] as the cause of diseases classified elsewhere: Secondary | ICD-10-CM | POA: Insufficient documentation

## 2019-02-01 DIAGNOSIS — I10 Essential (primary) hypertension: Secondary | ICD-10-CM | POA: Insufficient documentation

## 2019-02-01 DIAGNOSIS — Z79899 Other long term (current) drug therapy: Secondary | ICD-10-CM | POA: Diagnosis not present

## 2019-02-01 LAB — URINE DRUG SCREEN, QUALITATIVE (ARMC ONLY)
Amphetamines, Ur Screen: NOT DETECTED
Barbiturates, Ur Screen: NOT DETECTED
Benzodiazepine, Ur Scrn: POSITIVE — AB
Cannabinoid 50 Ng, Ur ~~LOC~~: NOT DETECTED
Cocaine Metabolite,Ur ~~LOC~~: NOT DETECTED
MDMA (Ecstasy)Ur Screen: NOT DETECTED
Methadone Scn, Ur: NOT DETECTED
Opiate, Ur Screen: NOT DETECTED
Phencyclidine (PCP) Ur S: NOT DETECTED
Tricyclic, Ur Screen: POSITIVE — AB

## 2019-02-01 LAB — LACTIC ACID, PLASMA: Lactic Acid, Venous: 1.1 mmol/L (ref 0.5–1.9)

## 2019-02-01 LAB — CBC
HCT: 40.9 % (ref 36.0–46.0)
Hemoglobin: 13.1 g/dL (ref 12.0–15.0)
MCH: 30.4 pg (ref 26.0–34.0)
MCHC: 32 g/dL (ref 30.0–36.0)
MCV: 94.9 fL (ref 80.0–100.0)
Platelets: 377 10*3/uL (ref 150–400)
RBC: 4.31 MIL/uL (ref 3.87–5.11)
RDW: 14.6 % (ref 11.5–15.5)
WBC: 13.8 10*3/uL — ABNORMAL HIGH (ref 4.0–10.5)
nRBC: 0 % (ref 0.0–0.2)

## 2019-02-01 LAB — COMPREHENSIVE METABOLIC PANEL
ALT: 8 U/L (ref 0–44)
AST: 15 U/L (ref 15–41)
Albumin: 4.3 g/dL (ref 3.5–5.0)
Alkaline Phosphatase: 68 U/L (ref 38–126)
Anion gap: 11 (ref 5–15)
BUN: 14 mg/dL (ref 8–23)
CO2: 29 mmol/L (ref 22–32)
Calcium: 9.4 mg/dL (ref 8.9–10.3)
Chloride: 98 mmol/L (ref 98–111)
Creatinine, Ser: 0.95 mg/dL (ref 0.44–1.00)
GFR calc Af Amer: >60 mL/min (ref >60)
GFR calc non Af Amer: >60 mL/min (ref >60)
Glucose, Bld: 109 mg/dL — ABNORMAL HIGH (ref 70–99)
Potassium: 4.8 mmol/L (ref 3.5–5.1)
Sodium: 138 mmol/L (ref 135–145)
Total Bilirubin: 0.5 mg/dL (ref 0.3–1.2)
Total Protein: 7.6 g/dL (ref 6.5–8.1)

## 2019-02-01 LAB — ETHANOL: Alcohol, Ethyl (B): <10 mg/dL (ref <10)

## 2019-02-01 LAB — APTT: aPTT: 33 seconds (ref 24–36)

## 2019-02-01 MED ORDER — METRONIDAZOLE IN NACL 5-0.79 MG/ML-% IV SOLN
500.0000 mg | Freq: Once | INTRAVENOUS | Status: AC
  Administered 2019-02-02: 500 mg via INTRAVENOUS
  Filled 2019-02-01: qty 100

## 2019-02-01 MED ORDER — SODIUM CHLORIDE 0.9 % IV SOLN
2.0000 g | Freq: Once | INTRAVENOUS | Status: AC
  Administered 2019-02-01: 2 g via INTRAVENOUS
  Filled 2019-02-01: qty 2

## 2019-02-01 MED ORDER — VANCOMYCIN HCL IN DEXTROSE 1-5 GM/200ML-% IV SOLN
1000.0000 mg | Freq: Once | INTRAVENOUS | Status: AC
  Administered 2019-02-02: 1000 mg via INTRAVENOUS
  Filled 2019-02-01: qty 200

## 2019-02-01 MED ORDER — SODIUM CHLORIDE 0.9 % IV BOLUS (SEPSIS)
1000.0000 mL | Freq: Once | INTRAVENOUS | Status: AC
  Administered 2019-02-01: 1000 mL via INTRAVENOUS

## 2019-02-01 MED ORDER — SODIUM CHLORIDE 0.9 % IV BOLUS (SEPSIS)
500.0000 mL | Freq: Once | INTRAVENOUS | Status: AC
  Administered 2019-02-02: 500 mL via INTRAVENOUS

## 2019-02-01 NOTE — Progress Notes (Signed)
CODE SEPSIS - PHARMACY COMMUNICATION  **Broad Spectrum Antibiotics should be administered within 1 hour of Sepsis diagnosis**  Time Code Sepsis Called/Page Received: 2233  Antibiotics Ordered: cefepime 2g IV x 1  Time of 1st antibiotic administration: 2329  Additional action taken by pharmacy:   If necessary, Name of Provider/Nurse Contacted:     Tobie Lords ,PharmD Clinical Pharmacist  02/01/2019  11:46 PM

## 2019-02-01 NOTE — ED Triage Notes (Signed)
Pt brought in under IVC for hallucinations and aggressive behavior.
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
## 2019-02-02 ENCOUNTER — Inpatient Hospital Stay: Payer: Medicare HMO

## 2019-02-02 DIAGNOSIS — N390 Urinary tract infection, site not specified: Secondary | ICD-10-CM | POA: Diagnosis not present

## 2019-02-02 DIAGNOSIS — A419 Sepsis, unspecified organism: Secondary | ICD-10-CM | POA: Diagnosis not present

## 2019-02-02 LAB — URINALYSIS, COMPLETE (UACMP) WITH MICROSCOPIC
Bilirubin Urine: NEGATIVE
Glucose, UA: NEGATIVE mg/dL
Ketones, ur: NEGATIVE mg/dL
Nitrite: POSITIVE — AB
Protein, ur: 100 mg/dL — AB
Specific Gravity, Urine: 1.014 (ref 1.005–1.030)
WBC, UA: >50 WBC/hpf — ABNORMAL HIGH (ref 0–5)
pH: 6 (ref 5.0–8.0)

## 2019-02-02 LAB — SARS CORONAVIRUS 2 (TAT 6-24 HRS): SARS Coronavirus 2: NEGATIVE

## 2019-02-02 MED ORDER — ENOXAPARIN SODIUM 40 MG/0.4ML ~~LOC~~ SOLN
40.0000 mg | SUBCUTANEOUS | Status: DC
  Administered 2019-02-03 – 2019-02-05 (×3): 40 mg via SUBCUTANEOUS
  Filled 2019-02-02 (×5): qty 0.4

## 2019-02-02 MED ORDER — ATORVASTATIN CALCIUM 20 MG PO TABS
40.0000 mg | ORAL_TABLET | Freq: Every day | ORAL | Status: DC
  Administered 2019-02-03 – 2019-02-04 (×2): 40 mg via ORAL
  Filled 2019-02-02 (×2): qty 2

## 2019-02-02 MED ORDER — GABAPENTIN 300 MG PO CAPS
300.0000 mg | ORAL_CAPSULE | Freq: Three times a day (TID) | ORAL | Status: DC
  Administered 2019-02-02 – 2019-02-05 (×11): 300 mg via ORAL
  Filled 2019-02-02 (×7): qty 1
  Filled 2019-02-02: qty 3
  Filled 2019-02-02 (×3): qty 1

## 2019-02-02 MED ORDER — GABAPENTIN 400 MG PO CAPS: 800.0000 mg | ORAL_CAPSULE | Freq: Three times a day (TID) | ORAL | Status: DC

## 2019-02-02 MED ORDER — FLUTICASONE PROPIONATE 50 MCG/ACT NA SUSP
1.0000 | Freq: Every day | NASAL | Status: DC | PRN
  Filled 2019-02-02: qty 16

## 2019-02-02 MED ORDER — NYSTATIN 100000 UNIT/ML MT SUSP
5.0000 mL | Freq: Four times a day (QID) | OROMUCOSAL | Status: DC
  Administered 2019-02-02 – 2019-02-05 (×13): 500000 [IU] via ORAL
  Filled 2019-02-02 (×15): qty 5

## 2019-02-02 MED ORDER — SODIUM CHLORIDE 0.9 % IV SOLN
INTRAVENOUS | Status: DC
  Administered 2019-02-02 – 2019-02-05 (×7): via INTRAVENOUS

## 2019-02-02 MED ORDER — ACETAMINOPHEN 325 MG PO TABS
650.0000 mg | ORAL_TABLET | Freq: Four times a day (QID) | ORAL | Status: DC | PRN
  Administered 2019-02-02 – 2019-02-03 (×4): 650 mg via ORAL
  Filled 2019-02-02 (×4): qty 2

## 2019-02-02 MED ORDER — PANTOPRAZOLE SODIUM 40 MG PO TBEC: 40.0000 mg | DELAYED_RELEASE_TABLET | Freq: Two times a day (BID) | ORAL | Status: DC | PRN

## 2019-02-02 MED ORDER — ALPRAZOLAM 0.5 MG PO TABS
0.5000 mg | ORAL_TABLET | Freq: Three times a day (TID) | ORAL | Status: DC | PRN
  Administered 2019-02-02 (×2): 0.5 mg via ORAL
  Filled 2019-02-02 (×2): qty 1

## 2019-02-02 MED ORDER — ONDANSETRON HCL 4 MG/2ML IJ SOLN: 4.0000 mg | Freq: Four times a day (QID) | INTRAMUSCULAR | Status: DC | PRN

## 2019-02-02 MED ORDER — AMITRIPTYLINE HCL 50 MG PO TABS
50.0000 mg | ORAL_TABLET | Freq: Every day | ORAL | Status: DC
  Administered 2019-02-02 – 2019-02-04 (×3): 50 mg via ORAL
  Filled 2019-02-02 (×4): qty 1

## 2019-02-02 MED ORDER — ACETAMINOPHEN 325 MG PO TABS
650.0000 mg | ORAL_TABLET | Freq: Once | ORAL | Status: AC
  Administered 2019-02-02: 650 mg via ORAL
  Filled 2019-02-02: qty 2

## 2019-02-02 MED ORDER — SODIUM CHLORIDE 0.9 % IV BOLUS
500.0000 mL | Freq: Once | INTRAVENOUS | Status: AC
  Administered 2019-02-02: 500 mL via INTRAVENOUS

## 2019-02-02 MED ORDER — CYCLOBENZAPRINE HCL 10 MG PO TABS
10.0000 mg | ORAL_TABLET | Freq: Every day | ORAL | Status: DC | PRN
  Administered 2019-02-04: 10 mg via ORAL
  Filled 2019-02-02: qty 1

## 2019-02-02 MED ORDER — ACETAMINOPHEN 650 MG RE SUPP: 650.0000 mg | Freq: Four times a day (QID) | RECTAL | Status: DC | PRN

## 2019-02-02 MED ORDER — SODIUM CHLORIDE 0.9 % IV SOLN
1.0000 g | INTRAVENOUS | Status: DC
  Administered 2019-02-02 – 2019-02-04 (×3): 1 g via INTRAVENOUS
  Filled 2019-02-02 (×2): qty 10
  Filled 2019-02-02: qty 1

## 2019-02-02 MED ORDER — ONDANSETRON HCL 4 MG PO TABS
4.0000 mg | ORAL_TABLET | Freq: Four times a day (QID) | ORAL | Status: DC | PRN
  Administered 2019-02-04: 4 mg via ORAL
  Filled 2019-02-02: qty 1

## 2019-02-02 NOTE — ED Notes (Signed)
Offered pt something else to drink but she declines, no c/o pain at this time, will ctm
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
## 2019-02-02 NOTE — ED Notes (Signed)
Pt requested xanax

## 2019-02-02 NOTE — Progress Notes (Addendum)
PHARMACY -  BRIEF ANTIBIOTIC NOTE   Pharmacy has received consult(s) for vancomycin/cefepime from an ED provider.  The patient's profile has been reviewed for ht/wt/allergies/indication/available labs.    One time order(s) placed for vancomycin 1g IV x 1 + cefepime 2g IV x 1  Further antibiotics/pharmacy consults should be ordered by admitting physician if indicated.                       Thank you,  Tobie Lords, PharmD, BCPS Clinical Pharmacist 02/02/2019  5:45 AM
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
## 2019-02-02 NOTE — ED Notes (Signed)
Pt ambulated to toilet, pt expresses no needs at this time

## 2019-02-02 NOTE — ED Notes (Signed)
Patient eating breakfast tray at this time. 

## 2019-02-02 NOTE — ED Notes (Signed)
Pt assisted to restroom by [employer] and had a loose stool, pt assisted back to bed
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
## 2019-02-02 NOTE — ED Notes (Signed)
Pt back from CT
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
## 2019-02-02 NOTE — ED Provider Notes (Signed)
Urine is consistent with UTI.  Will send off culture.  Patient already has antibiotics going.  Discussed with the hospital team and they will admit patient.  IVC in place.  Will hold off on psych consult at this time.   Vanessa Bowman, MD 02/02/19 [phone]
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
## 2019-02-02 NOTE — ED Notes (Signed)
Pt assisted to bathroom for a bowel movement, pt gait steady. Pt given lunch tray

## 2019-02-02 NOTE — ED Notes (Signed)
IVC/ Brief Consult completed/ Pending re evaluation in Am

## 2019-02-02 NOTE — ED Notes (Signed)
Report given to Matt, RN

## 2019-02-02 NOTE — ED Notes (Signed)
Breakfast tray was given with juice. 

## 2019-02-03 DIAGNOSIS — F1320 Sedative, hypnotic or anxiolytic dependence, uncomplicated: Secondary | ICD-10-CM

## 2019-02-03 DIAGNOSIS — A419 Sepsis, unspecified organism: Secondary | ICD-10-CM | POA: Diagnosis not present

## 2019-02-03 LAB — CBC
HCT: 36.6 % (ref 36.0–46.0)
Hemoglobin: 11.5 g/dL — ABNORMAL LOW (ref 12.0–15.0)
MCH: 29.8 pg (ref 26.0–34.0)
MCHC: 31.4 g/dL (ref 30.0–36.0)
MCV: 94.8 fL (ref 80.0–100.0)
Platelets: 299 10*3/uL (ref 150–400)
RBC: 3.86 MIL/uL — ABNORMAL LOW (ref 3.87–5.11)
RDW: 14.6 % (ref 11.5–15.5)
WBC: 7 10*3/uL (ref 4.0–10.5)
nRBC: 0 % (ref 0.0–0.2)

## 2019-02-03 LAB — COMPREHENSIVE METABOLIC PANEL
ALT: 7 U/L (ref 0–44)
AST: 11 U/L — ABNORMAL LOW (ref 15–41)
Albumin: 3.1 g/dL — ABNORMAL LOW (ref 3.5–5.0)
Alkaline Phosphatase: 50 U/L (ref 38–126)
Anion gap: 8 (ref 5–15)
BUN: 8 mg/dL (ref 8–23)
CO2: 27 mmol/L (ref 22–32)
Calcium: 8.2 mg/dL — ABNORMAL LOW (ref 8.9–10.3)
Chloride: 105 mmol/L (ref 98–111)
Creatinine, Ser: 0.5 mg/dL (ref 0.44–1.00)
GFR calc Af Amer: >60 mL/min (ref >60)
GFR calc non Af Amer: >60 mL/min (ref >60)
Glucose, Bld: 95 mg/dL (ref 70–99)
Potassium: 4 mmol/L (ref 3.5–5.1)
Sodium: 140 mmol/L (ref 135–145)
Total Bilirubin: 0.5 mg/dL (ref 0.3–1.2)
Total Protein: 5.8 g/dL — ABNORMAL LOW (ref 6.5–8.1)

## 2019-02-03 MED ORDER — LORAZEPAM 0.5 MG PO TABS
0.5000 mg | ORAL_TABLET | Freq: Two times a day (BID) | ORAL | Status: DC | PRN
  Administered 2019-02-03 – 2019-02-05 (×4): 0.5 mg via ORAL
  Filled 2019-02-03 (×4): qty 1

## 2019-02-03 MED ORDER — CLONAZEPAM 0.5 MG PO TABS: 0.5000 mg | ORAL_TABLET | Freq: Every day | ORAL | Status: DC | PRN

## 2019-02-03 MED ORDER — LORAZEPAM 0.5 MG PO TABS
0.5000 mg | ORAL_TABLET | Freq: Every day | ORAL | Status: DC | PRN
  Filled 2019-02-03: qty 1

## 2019-02-03 MED ORDER — LORAZEPAM 0.5 MG PO TABS
0.5000 mg | ORAL_TABLET | Freq: Every day | ORAL | Status: DC | PRN
  Administered 2019-02-03: 0.5 mg via ORAL
  Filled 2019-02-03: qty 1

## 2019-02-03 MED ORDER — LISINOPRIL 20 MG PO TABS
20.0000 mg | ORAL_TABLET | Freq: Every day | ORAL | Status: DC
  Administered 2019-02-03 – 2019-02-05 (×3): 20 mg via ORAL
  Filled 2019-02-03 (×3): qty 1

## 2019-02-03 MED ORDER — CITALOPRAM HYDROBROMIDE 20 MG PO TABS
10.0000 mg | ORAL_TABLET | Freq: Every day | ORAL | Status: DC
  Administered 2019-02-04 – 2019-02-05 (×2): 10 mg via ORAL
  Filled 2019-02-03 (×2): qty 1

## 2019-02-03 NOTE — Plan of Care (Signed)
  Problem: Education: Goal: Knowledge of General Education information will improve Description Including pain rating scale, medication(s)/side effects and non-pharmacologic comfort measures Outcome: Progressing   
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
## 2019-02-03 NOTE — ED Notes (Signed)
ED TO INPATIENT HANDOFF REPORT  ED Nurse Name and Phone #: [phone]   S Name/Age/Gender Jessica Larson 63 y.o. female Room/Bed: ED19HA/ED19HA  Code Status   Code Status: Full Code  Home/SNF/Other Home Patient oriented to: self, place, time and situation Is this baseline? Yes   Triage Complete: Triage complete  Chief Complaint IVC  Triage Note Pt brought in under IVC for hallucinations and aggressive behavior.    Allergies No Known Allergies  Level of Care/Admitting Diagnosis ED Disposition    ED Disposition Condition [facility]: [place] [[id number]]  Level of Care: Med-Surg [16]  Covid Evaluation: Asymptomatic Screening Protocol (No Symptoms)  Diagnosis: Sepsis Surgery By [facility] [id number]  Admitting Physician: Christel Mormon [id number]  Attending Physician: Christel Mormon [id number]  Estimated length of stay: 3 - 4 days  Certification:: I certify this patient will need inpatient services for at least 2 midnights  PT Class (Do Not Modify): Inpatient [101]  PT Acc Code (Do Not Modify): Private [1]       B Medical/Surgery History Past Medical History:  Diagnosis Date  . Abnormal gait   . Affective disorder ([place])   . Anxiety   . Atypical squamous cells of undetermined significance (ASCUS) on Papanicolaou smear of cervix   . Cancer ([place])   . Chronic back pain   . Chronic prescription benzodiazepine use   . Colon cancer ([place])   . High cholesterol   . History of loop recorder   . History of shingles   . Hypertension   . Osteoporosis   . Tremor    Past Surgical History:  Procedure Laterality Date  . COLON SURGERY       A IV Location/Drains/Wounds Patient Lines/Drains/Airways Status   Active Line/Drains/Airways    Name:   Placement date:   Placement time:   Site:   Days:   Peripheral IV 02/01/19 Left Forearm   02/01/19    2245    Forearm   2          Intake/Output Last 24 hours  Intake/Output Summary (Last 24 hours)  at 02/03/2019 0202 Last data filed at 02/02/2019 0403 Gross per 24 hour  Intake 500 ml  Output -  Net 500 ml    Labs/Imaging Results for orders placed or performed during the hospital encounter of 02/01/19 (from the past 48 hour(s))  CBC     Status: Abnormal   Collection Time: 02/01/19  8:28 PM  Result Value Ref Range   WBC 13.8 (H) 4.0 - 10.5 K/uL   RBC 4.31 3.87 - 5.11 MIL/uL   Hemoglobin 13.1 12.0 - 15.0 g/dL   HCT 40.9 36.0 - 46.0 %   MCV 94.9 80.0 - 100.0 fL   MCH 30.4 26.0 - 34.0 pg   MCHC 32.0 30.0 - 36.0 g/dL   RDW 14.6 11.5 - 15.5 %   Platelets 377 150 - 400 K/uL   nRBC 0.0 0.0 - 0.2 %    Comment: Performed at  [facility], [address] [place], [place] [address]  Comprehensive metabolic panel     Status: Abnormal   Collection Time: 02/01/19  8:28 PM  Result Value Ref Range   Sodium 138 135 - 145 mmol/L   Potassium 4.8 3.5 - 5.1 mmol/L   Chloride 98 98 - 111 mmol/L   CO2 29 22 - 32 mmol/L   Glucose, Bld 109 (H) 70 - 99 mg/dL   BUN 14 8 - 23 mg/dL  Creatinine, Ser 0.95 0.44 - 1.00 mg/dL   Calcium 9.4 8.9 - 10.3 mg/dL   Total Protein 7.6 6.5 - 8.1 g/dL   Albumin 4.3 3.5 - 5.0 g/dL   AST 15 15 - 41 U/L   ALT 8 0 - 44 U/L   Alkaline Phosphatase 68 38 - 126 U/L   Total Bilirubin 0.5 0.3 - 1.2 mg/dL   GFR calc non Af Amer >60 >60 mL/min   GFR calc Af Amer >60 >60 mL/min   Anion gap 11 5 - 15    Comment: Performed at [facility], [address] [place], [place] [address]  Ethanol     Status: None   Collection Time: 02/01/19  8:28 PM  Result Value Ref Range   Alcohol, Ethyl (B) <10 <10 mg/dL    Comment: (NOTE) Lowest detectable limit for serum alcohol is 10 mg/dL. For medical purposes only. Performed at [facility], [address] [place], [place] [address]   APTT     Status: None   Collection Time: 02/01/19 11:00 PM  Result Value Ref Range   aPTT 33 24 - 36 seconds    Comment: Performed at [facility], [address] [place], [place] [address]  Lactic acid, plasma     Status: None   Collection Time: 02/01/19 11:00 PM  Result Value Ref Range   Lactic Acid, Venous 1.1 0.5 - 1.9 mmol/L    Comment: Performed at [facility], [address], [place], [place] [address]  Urine Drug Screen, Qualitative ([place] only)     Status: Abnormal   Collection Time: 02/01/19 11:20 PM  Result Value Ref Range   Tricyclic, Ur Screen POSITIVE (A) NONE DETECTED   Amphetamines, Ur Screen NONE DETECTED NONE DETECTED   MDMA (Ecstasy)Ur Screen NONE DETECTED NONE DETECTED   Cocaine Metabolite,Ur [place] NONE DETECTED NONE DETECTED   Opiate, Ur Screen NONE DETECTED NONE DETECTED   Phencyclidine (PCP) Ur S NONE DETECTED NONE DETECTED   Cannabinoid 50 Ng, Ur  NONE DETECTED NONE DETECTED   Barbiturates, Ur Screen NONE DETECTED NONE DETECTED   Benzodiazepine, Ur Scrn POSITIVE (A) NONE DETECTED   Methadone Scn, Ur NONE DETECTED NONE DETECTED    Comment: (NOTE) Tricyclics + metabolites, urine    Cutoff 1000 ng/mL Amphetamines + metabolites, urine  Cutoff 1000 ng/mL MDMA (Ecstasy), urine              Cutoff 500 ng/mL Cocaine Metabolite, urine          Cutoff 300 ng/mL Opiate + metabolites, urine        Cutoff 300 ng/mL Phencyclidine (PCP), urine         Cutoff 25 ng/mL Cannabinoid, urine                 Cutoff 50 ng/mL Barbiturates + metabolites, urine  Cutoff 200 ng/mL Benzodiazepine, urine              Cutoff 200 ng/mL Methadone, urine                   Cutoff 300 ng/mL The urine drug screen provides only a preliminary, unconfirmed analytical test result and should not be used for non-medical purposes. Clinical consideration and professional judgment should be applied to any positive drug screen result due to possible interfering substances. A more specific alternate chemical method must be used in order to obtain a confirmed analytical result. Gas chromatography / mass spectrometry (GC/MS) is the  preferred confirmat ory method. Performed at [facility], [address] [place], [place] [address]   Urinalysis, Complete w Microscopic     Status: Abnormal   Collection Time: 02/02/19 12:08 AM  Result Value Ref Range   Color, Urine AMBER (A) YELLOW    Comment: BIOCHEMICALS MAY BE AFFECTED BY COLOR   APPearance TURBID (A) CLEAR   Specific Gravity, Urine 1.014 1.005 - 1.030   pH 6.0 5.0 - 8.0   Glucose, UA NEGATIVE NEGATIVE mg/dL   Hgb urine dipstick SMALL (A) NEGATIVE   Bilirubin Urine NEGATIVE NEGATIVE   Ketones, ur NEGATIVE NEGATIVE mg/dL   Protein, ur 100 (A) NEGATIVE mg/dL   Nitrite POSITIVE (A) NEGATIVE   Leukocytes,Ua LARGE (A) NEGATIVE   RBC / HPF 21-50 0 - 5 RBC/hpf   WBC, UA >50 (H) 0 - 5 WBC/hpf   Bacteria, UA FEW (A) NONE SEEN   Squamous Epithelial / LPF 0-5 0 - 5   Mucus PRESENT    Hyaline Casts, UA PRESENT     Comment: Performed at [facility], [address] [place], [place] [address]  SARS CORONAVIRUS 2 (TAT 6-24 HRS) Nasopharyngeal Nasopharyngeal Swab     Status: None   Collection Time: 02/02/19  2:09 AM   Specimen: Nasopharyngeal Swab  Result Value Ref Range   SARS Coronavirus 2 NEGATIVE NEGATIVE    Comment: (NOTE) SARS-CoV-2 target nucleic acids are NOT DETECTED. The SARS-CoV-2 RNA is generally detectable in upper and lower respiratory specimens during the acute phase of infection. Negative results do not preclude SARS-CoV-2 infection, do not rule out co-infections with other pathogens, and should not be used as the sole basis for treatment or other patient management decisions. Negative results must be combined with clinical observations, patient history, and epidemiological information. The expected result is Negative. Fact Sheet for Patients: SugarRoll.be Fact Sheet for Healthcare Providers: https://www.woods-mathews.com/ This test is not yet approved or cleared by the [place]  FDA and  has been authorized for detection and/or diagnosis of SARS-CoV-2 by FDA under an Emergency Use Authorization (EUA). This EUA will remain  in effect (meaning this test can be used) for the duration of the COVID-19 declaration under Section 56 4(b)(1) of the Act, 21 U.S.C. section 360bbb-3(b)(1), unless the authorization is terminated or revoked sooner. Performed at [facility], [place] [address], [place], [place] [address]    Ct Head Wo Contrast  Result Date: 02/02/2019 CLINICAL DATA:  Confusion with agitation and hallucinations EXAM: CT HEAD WITHOUT CONTRAST TECHNIQUE: Contiguous axial images were obtained from the base of the skull through the vertex without intravenous contrast. COMPARISON:  December 08, 2018 FINDINGS: Brain: Ventricles are normal in size and configuration. There is no intracranial mass, hemorrhage, extra-axial fluid collection, or midline shift. Brain parenchyma appears unremarkable. No evident acute infarct. Vascular: There is no hyperdense vessel. There is calcification in each carotid siphon region. Skull: The bony calvarium appears intact. Sinuses/Orbits: Visualized paranasal sinuses are clear. There is a concha bullosa on each side, an anatomic variant. Visualized orbits appear symmetric bilaterally. Other: The mastoid air cells are clear. IMPRESSION: There are foci of arterial vascular calcification. Study otherwise unremarkable. Brain parenchyma appears normal. Electronically Signed   By: Lowella Grip III M.D.   On: 02/02/2019 08:49   Dg Chest Port 1 View  Result Date: 02/01/2019 CLINICAL DATA:  Sepsis EXAM: PORTABLE CHEST 1 VIEW COMPARISON:  12/18/2018 FINDINGS: The heart size is stable. A loop recorder is noted. Aortic calcifications  are noted. There is no pneumothorax. No large pleural effusion. No acute osseous abnormality. IMPRESSION: No active disease. Electronically Signed   By: Constance Holster M.D.   On: 02/01/2019 23:03    Pending  Labs Unresulted Labs (From admission, onward)    Start     Ordered   02/03/19 0500  Comprehensive metabolic panel  Tomorrow morning,   STAT     02/02/19 0904   02/03/19 0500  CBC  Tomorrow morning,   STAT     02/02/19 0904   02/01/19 2359  Urinalysis, Complete w Microscopic  Once,   R     02/01/19 2359   02/01/19 2233  Blood Culture (routine x 2)  BLOOD CULTURE X 2,   STAT     02/01/19 2232   02/01/19 2233  Urine culture  ONCE - STAT,   STAT     02/01/19 2232          Vitals/Pain Today's Vitals   02/02/19 1000 02/02/19 1011 02/02/19 1145 02/02/19 1530  BP: (!) 146/70   134/63  Pulse: 72   82  Resp: 12   18  Temp:    98.3 F (36.8 C)  TempSrc:    Oral  SpO2: 96%   99%  Weight:      PainSc:  0-No pain 8      Isolation Precautions No active isolations  Medications Medications  cefTRIAXone (ROCEPHIN) 1 g in sodium chloride 0.9 % 100 mL IVPB (1 g Intravenous New Bag/Given 02/02/19 1012)  ALPRAZolam (XANAX) tablet 0.5 mg (0.5 mg Oral Given 02/02/19 1749)  amitriptyline (ELAVIL) tablet 50 mg (50 mg Oral Given 02/02/19 2123)  atorvastatin (LIPITOR) tablet 40 mg (has no administration in time range)  cyclobenzaprine (FLEXERIL) tablet 10 mg (has no administration in time range)  fluticasone (FLONASE) 50 MCG/ACT nasal spray 1 spray (has no administration in time range)  pantoprazole (PROTONIX) EC tablet 40 mg (has no administration in time range)  enoxaparin (LOVENOX) injection 40 mg (has no administration in time range)  0.9 %  sodium chloride infusion ( Intravenous New Bag/Given 02/02/19 1007)  acetaminophen (TYLENOL) tablet 650 mg (650 mg Oral Given 02/02/19 2223)    Or  acetaminophen (TYLENOL) suppository 650 mg ( Rectal See Alternative 02/02/19 2223)  ondansetron (ZOFRAN) tablet 4 mg (has no administration in time range)    Or  ondansetron (ZOFRAN) injection 4 mg (has no administration in time range)  nystatin (MYCOSTATIN) 100000 UNIT/ML suspension 500,000 Units (500,000 Units  Oral Given 02/02/19 2123)  gabapentin (NEURONTIN) capsule 300 mg (300 mg Oral Given 02/02/19 2123)  sodium chloride 0.9 % bolus 1,000 mL (0 mLs Intravenous Stopped 02/02/19 0054)    And  sodium chloride 0.9 % bolus 500 mL (0 mLs Intravenous Stopped 02/02/19 0122)  ceFEPIme (MAXIPIME) 2 g in sodium chloride 0.9 % 100 mL IVPB (0 g Intravenous Stopped 02/02/19 0054)  metroNIDAZOLE (FLAGYL) IVPB 500 mg (0 mg Intravenous Stopped 02/02/19 0206)  vancomycin (VANCOCIN) IVPB 1000 mg/200 mL premix (0 mg Intravenous Stopped 02/02/19 0319)  sodium chloride 0.9 % bolus 500 mL (0 mLs Intravenous Stopped 02/02/19 0403)  acetaminophen (TYLENOL) tablet 650 mg (650 mg Oral Given 02/02/19 0731)    Mobility walks Low fall risk   Focused Assessments Neuro Assessment Handoff:  Swallow screen pass? Yes  Cardiac Rhythm: Normal sinus rhythm(HR 79)       Neuro Assessment:   Neuro Checks:      Last Documented NIHSS Modified Score:   Has  TPA been given? No If patient is a Neuro Trauma and patient is going to OR before floor call report to [place] nurse: [phone] or [phone]     R Recommendations: See Admitting Provider Note  Report given to:   Additional Notes: Patient in under IVC

## 2019-02-03 NOTE — Consult Note (Addendum)
[place] Psychiatry Consult   Reason for Consult:  Psychosis  Referring Physician:  Dr Governor Rooks Patient Identification: Jessica Larson MRN:  [id number] Principal Diagnosis: Sepsis Diagnosis:  Active Problems:   Benzodiazepine dependence ([place])   Sepsis ([place])  Total Time spent with patient: 1 hour  Subjective:   Jessica Larson is a 63 y.o. female patient admitted with sepsis.  "I'm good."  Patient seen and evaluated in person by this provider.  Engages easily in conversation with no signs of psychosis or paranoia.  Reports moderate anxiety and explained the change from her Xanax to Klonopin, longer lasting and less potential for abuse.  Per notes and her report on assessment she feels her daughter and her daughter's boyfriend frequently take her Xanax.  Recommend Celexa 10 mg daily to also assist with anxiety and potentially tapering off the benzodiazepines.  Denies suicidal/homicidal ideations, hallucinations, and withdrawal symptoms.  Psychiatrically stable.  11/6 per Dr Sheliah Mends:  Patient seen briefly in the ED.  Interview limited as patient was undergoing medical procedures.  Patient alleges that her daughter and her daughter's boyfriend are drug addicts who steal her Xanax.  Other than this complaint, patient displays no paranoia no psychosis.  Denies SI she denies HI.  When further questioned patient states that her daughter has been taking her Xanax for years and will frequently come over just following her outpatient appointment in order to take her medications.  Patient also alleges that her daughter at times will also take her pain pills.  Patient states that she can take her pain pills this time as she did not have any prescribed to her.  No bizarre delusions evidenced.  While it is possible that patient is suffering from paranoid delusions,  it is also possible that this is unfortunate story is true.  Patient was speaking to writer clearly and coherently with no  evidence of psychosis.  Writer was unable to contact daughter for collateral information at this time.  Psychiatry will continue to follow, and do a more thorough evaluation tomorrow after the patient's UTI has been  medically managed.  Per MD on admission:  Jessica Larson is a 63 y.o. female with past medical history of chronic benzodiazepine dependence, anxiety, chronic pain, chronic polysubstance abuse, here with reported confusion.  History is somewhat limited due to confusion.  However, per report, the patient has been increasingly confused, hallucinating, and agitated.  She has a reported history of generalized anxiety and adjustment disorder and has been seen at behavioral health.  Per report, she has been increasingly hallucinating and aggressive towards her daughter, so she arrives under IVC.  On my assessment, she states that she feels like her daughter is out to get her and is stealing her money.  She exhibits paranoia.  She also reports that she feels generally unwell, and has had some urinary symptoms such as moderate dysuria.  She does tell me that Dr. Priscella Mann is her primary psychiatrist at [facility].  Past Psychiatric History: anxiety, benzodiazepine use disorder  Risk to Self:  none Risk to Others:  none Prior Inpatient Therapy:  [facility] Prior Outpatient Therapy:  yes  Past Medical History:  Past Medical History:  Diagnosis Date  . Abnormal gait   . Affective disorder ([place])   . Anxiety   . Atypical squamous cells of undetermined significance (ASCUS) on Papanicolaou smear of cervix   . Cancer ([place])   . Chronic back pain   . Chronic prescription benzodiazepine use   . Colon cancer ([place])   .  High cholesterol   . History of loop recorder   . History of shingles   . Hypertension   . Osteoporosis   . Tremor     Past Surgical History:  Procedure Laterality Date  . COLON SURGERY     Family History: No family history on file. Family Psychiatric  History: none Social History:   Social History   Substance and Sexual Activity  Alcohol Use Not Currently  . Frequency: Never     Social History   Substance and Sexual Activity  Drug Use Never    Social History   Socioeconomic History  . Marital status: Divorced    Spouse name: Not on file  . Number of children: Not on file  . Years of education: Not on file  . Highest education level: Not on file  Occupational History  . Not on file  Social Needs  . Financial resource strain: Not on file  . Food insecurity    Worry: Not on file    Inability: Not on file  . Transportation needs    Medical: Not on file    Non-medical: Not on file  Tobacco Use  . Smoking status: Former Smoker    Years: 0.00    Quit date: 09/15/2012    Years since quitting: 6.3  . Smokeless tobacco: Never Used  Substance and Sexual Activity  . Alcohol use: Not Currently    Frequency: Never  . Drug use: Never  . Sexual activity: Not on file  Lifestyle  . Physical activity    Days per week: Not on file    Minutes per session: Not on file  . Stress: Not on file  Relationships  . Social Herbalist on phone: Not on file    Gets together: Not on file    Attends religious service: Not on file    Active member of club or organization: Not on file    Attends meetings of clubs or organizations: Not on file    Relationship status: Not on file  Other Topics Concern  . Not on file  Social History Narrative   Lives at home with daughter, daughter's partner, two grandchildren.   Additional Social History:    Allergies:  No Known Allergies  Labs:  Results for orders placed or performed during the hospital encounter of 02/01/19 (from the past 48 hour(s))  CBC     Status: Abnormal   Collection Time: 02/01/19  8:28 PM  Result Value Ref Range   WBC 13.8 (H) 4.0 - 10.5 K/uL   RBC 4.31 3.87 - 5.11 MIL/uL   Hemoglobin 13.1 12.0 - 15.0 g/dL   HCT 40.9 36.0 - 46.0 %   MCV 94.9 80.0 - 100.0 fL   MCH 30.4 26.0 - 34.0 pg   MCHC  32.0 30.0 - 36.0 g/dL   RDW 14.6 11.5 - 15.5 %   Platelets 377 150 - 400 K/uL   nRBC 0.0 0.0 - 0.2 %    Comment: Performed at [facility], [address] [place], [place] [address]  Comprehensive metabolic panel     Status: Abnormal   Collection Time: 02/01/19  8:28 PM  Result Value Ref Range   Sodium 138 135 - 145 mmol/L   Potassium 4.8 3.5 - 5.1 mmol/L   Chloride 98 98 - 111 mmol/L   CO2 29 22 - 32 mmol/L   Glucose, Bld 109 (H) 70 - 99 mg/dL   BUN 14 8 - 23 mg/dL  Creatinine, Ser 0.95 0.44 - 1.00 mg/dL   Calcium 9.4 8.9 - 10.3 mg/dL   Total Protein 7.6 6.5 - 8.1 g/dL   Albumin 4.3 3.5 - 5.0 g/dL   AST 15 15 - 41 U/L   ALT 8 0 - 44 U/L   Alkaline Phosphatase 68 38 - 126 U/L   Total Bilirubin 0.5 0.3 - 1.2 mg/dL   GFR calc non Af Amer >60 >60 mL/min   GFR calc Af Amer >60 >60 mL/min   Anion gap 11 5 - 15    Comment: Performed at [facility], [address] [place], [place] [address]  Ethanol     Status: None   Collection Time: 02/01/19  8:28 PM  Result Value Ref Range   Alcohol, Ethyl (B) <10 <10 mg/dL    Comment: (NOTE) Lowest detectable limit for serum alcohol is 10 mg/dL. For medical purposes only. Performed at [facility], [address] [place], [place] [address]   APTT     Status: None   Collection Time: 02/01/19 11:00 PM  Result Value Ref Range   aPTT 33 24 - 36 seconds    Comment: Performed at [facility], [address] [place], [place] [address]  Lactic acid, plasma     Status: None   Collection Time: 02/01/19 11:00 PM  Result Value Ref Range   Lactic Acid, Venous 1.1 0.5 - 1.9 mmol/L    Comment: Performed at [facility], [address] [place],  [address]  Urine Drug Screen, Qualitative ([place] only)     Status: Abnormal   Collection Time: 02/01/19 11:20 PM  Result Value Ref Range   Tricyclic, Ur Screen POSITIVE (A) NONE DETECTED   Amphetamines, Ur Screen NONE DETECTED NONE DETECTED   MDMA  (Ecstasy)Ur Screen NONE DETECTED NONE DETECTED   Cocaine Metabolite,Ur [place] NONE DETECTED NONE DETECTED   Opiate, Ur Screen NONE DETECTED NONE DETECTED   Phencyclidine (PCP) Ur S NONE DETECTED NONE DETECTED   Cannabinoid 50 Ng, Ur [place] NONE DETECTED NONE DETECTED   Barbiturates, Ur Screen NONE DETECTED NONE DETECTED   Benzodiazepine, Ur Scrn POSITIVE (A) NONE DETECTED   Methadone Scn, Ur NONE DETECTED NONE DETECTED    Comment: (NOTE) Tricyclics + metabolites, urine    Cutoff 1000 ng/mL Amphetamines + metabolites, urine  Cutoff 1000 ng/mL MDMA (Ecstasy), urine              Cutoff 500 ng/mL Cocaine Metabolite, urine          Cutoff 300 ng/mL Opiate + metabolites, urine        Cutoff 300 ng/mL Phencyclidine (PCP), urine         Cutoff 25 ng/mL Cannabinoid, urine                 Cutoff 50 ng/mL Barbiturates + metabolites, urine  Cutoff 200 ng/mL Benzodiazepine, urine              Cutoff 200 ng/mL Methadone, urine                   Cutoff 300 ng/mL The urine drug screen provides only a preliminary, unconfirmed analytical test result and should not be used for non-medical purposes. Clinical consideration and professional judgment should be applied to any positive drug screen result due to possible interfering substances. A more specific alternate chemical method must be used in order to obtain a confirmed analytical result. Gas chromatography / mass spectrometry (GC/MS) is the preferred  confirmat ory method. Performed at [facility], [address] [place], [place] [address]   Urinalysis, Complete w Microscopic     Status: Abnormal   Collection Time: 02/02/19 12:08 AM  Result Value Ref Range   Color, Urine AMBER (A) YELLOW    Comment: BIOCHEMICALS MAY BE AFFECTED BY COLOR   APPearance TURBID (A) CLEAR   Specific Gravity, Urine 1.014 1.005 - 1.030   pH 6.0 5.0 - 8.0   Glucose, UA NEGATIVE NEGATIVE mg/dL   Hgb urine dipstick SMALL (A) NEGATIVE   Bilirubin Urine NEGATIVE  NEGATIVE   Ketones, ur NEGATIVE NEGATIVE mg/dL   Protein, ur 100 (A) NEGATIVE mg/dL   Nitrite POSITIVE (A) NEGATIVE   Leukocytes,Ua LARGE (A) NEGATIVE   RBC / HPF 21-50 0 - 5 RBC/hpf   WBC, UA >50 (H) 0 - 5 WBC/hpf   Bacteria, UA FEW (A) NONE SEEN   Squamous Epithelial / LPF 0-5 0 - 5   Mucus PRESENT    Hyaline Casts, UA PRESENT     Comment: Performed at [facility], [address] [place], [place] [address]  SARS CORONAVIRUS 2 (TAT 6-24 HRS) Nasopharyngeal Nasopharyngeal Swab     Status: None   Collection Time: 02/02/19  2:09 AM   Specimen: Nasopharyngeal Swab  Result Value Ref Range   SARS Coronavirus 2 NEGATIVE NEGATIVE    Comment: (NOTE) SARS-CoV-2 target nucleic acids are NOT DETECTED. The SARS-CoV-2 RNA is generally detectable in upper and lower respiratory specimens during the acute phase of infection. Negative results do not preclude SARS-CoV-2 infection, do not rule out co-infections with other pathogens, and should not be used as the sole basis for treatment or other patient management decisions. Negative results must be combined with clinical observations, patient history, and epidemiological information. The expected result is Negative. Fact Sheet for Patients: SugarRoll.be Fact Sheet for Healthcare Providers: https://www.woods-mathews.com/ This test is not yet approved or cleared by the [place] FDA and  has been authorized for detection and/or diagnosis of SARS-CoV-2 by FDA under an Emergency Use Authorization (EUA). This EUA will remain  in effect (meaning this test can be used) for the duration of the COVID-19 declaration under Section 56 4(b)(1) of the Act, 21 U.S.C. section 360bbb-3(b)(1), unless the authorization is terminated or revoked sooner. Performed at [facility], [place] [address] [place], [place] [address]   Comprehensive metabolic panel     Status: Abnormal   Collection Time: 02/03/19   6:14 AM  Result Value Ref Range   Sodium 140 135 - 145 mmol/L   Potassium 4.0 3.5 - 5.1 mmol/L   Chloride 105 98 - 111 mmol/L   CO2 27 22 - 32 mmol/L   Glucose, Bld 95 70 - 99 mg/dL   BUN 8 8 - 23 mg/dL   Creatinine, Ser 0.50 0.44 - 1.00 mg/dL   Calcium 8.2 (L) 8.9 - 10.3 mg/dL   Total Protein 5.8 (L) 6.5 - 8.1 g/dL   Albumin 3.1 (L) 3.5 - 5.0 g/dL   AST 11 (L) 15 - 41 U/L   ALT 7 0 - 44 U/L   Alkaline Phosphatase 50 38 - 126 U/L   Total Bilirubin 0.5 0.3 - 1.2 mg/dL   GFR calc non Af Amer >60 >60 mL/min   GFR calc Af Amer >60 >60 mL/min   Anion gap 8 5 - 15    Comment: Performed at [facility], [address] [place],  [address]  CBC  Status: Abnormal   Collection Time: 02/03/19  6:14 AM  Result Value Ref Range   WBC 7.0 4.0 - 10.5 K/uL   RBC 3.86 (L) 3.87 - 5.11 MIL/uL   Hemoglobin 11.5 (L) 12.0 - 15.0 g/dL   HCT 36.6 36.0 - 46.0 %   MCV 94.8 80.0 - 100.0 fL   MCH 29.8 26.0 - 34.0 pg   MCHC 31.4 30.0 - 36.0 g/dL   RDW 14.6 11.5 - 15.5 %   Platelets 299 150 - 400 K/uL   nRBC 0.0 0.0 - 0.2 %    Comment: Performed at [facility], [address] [place], [place] [address]    Current Facility-Administered Medications  Medication Dose Route Frequency Provider Last Rate Last Dose  . 0.9 %  sodium chloride infusion   Intravenous Continuous Lavina Hamman, MD 100 mL/hr at 02/03/19 0615    . acetaminophen (TYLENOL) tablet 650 mg  650 mg Oral Q6H PRN Lavina Hamman, MD   650 mg at 02/03/19 [id number]   Or  . acetaminophen (TYLENOL) suppository 650 mg  650 mg Rectal Q6H PRN Lavina Hamman, MD      . amitriptyline (ELAVIL) tablet 50 mg  50 mg Oral QHS Lavina Hamman, MD   50 mg at 02/02/19 2123  . atorvastatin (LIPITOR) tablet 40 mg  40 mg Oral q1800 Lavina Hamman, MD      . cefTRIAXone (ROCEPHIN) 1 g in sodium chloride 0.9 % 100 mL IVPB  1 g Intravenous Q24H Lavina Hamman, MD 200 mL/hr at 02/02/19 1012 1 g at 02/02/19 1012  . cyclobenzaprine  (FLEXERIL) tablet 10 mg  10 mg Oral Daily PRN Lavina Hamman, MD      . enoxaparin (LOVENOX) injection 40 mg  40 mg Subcutaneous Q24H Lavina Hamman, MD      . fluticasone [facility]) 50 MCG/ACT nasal spray 1 spray  1 spray Each Nare Daily PRN Lavina Hamman, MD      . gabapentin (NEURONTIN) capsule 300 mg  300 mg Oral TID Lavina Hamman, MD   300 mg at 02/02/19 2123  . LORazepam (ATIVAN) tablet 0.5 mg  0.5 mg Oral Daily PRN Patrecia Pour, NP      . nystatin (MYCOSTATIN) 100000 UNIT/ML suspension 500,000 Units  5 mL Oral QID Lavina Hamman, MD   500,000 Units at 02/02/19 2123  . ondansetron (ZOFRAN) tablet 4 mg  4 mg Oral Q6H PRN Lavina Hamman, MD       Or  . ondansetron [facility]) injection 4 mg  4 mg Intravenous Q6H PRN Lavina Hamman, MD      . pantoprazole (PROTONIX) EC tablet 40 mg  40 mg Oral BID PRN Lavina Hamman, MD        Musculoskeletal: Strength & Muscle Tone: decreased Gait & Station: did not witness Patient leans: N/A  Psychiatric Specialty Exam: Physical Exam  Nursing note and vitals reviewed. Constitutional: She is oriented to person, place, and time. She appears well-developed and well-nourished.  HENT:  Head: Normocephalic.  Neck: Normal range of motion.  Respiratory: Effort normal.  Neurological: She is alert and oriented to person, place, and time.    Review of Systems  Psychiatric/Behavioral: Positive for depression. The patient is nervous/anxious.   All other systems reviewed and are negative.   Blood pressure 128/67, pulse 86, temperature 98.6 F (37 C), temperature source Oral, resp. rate (!) 24, weight 49.9 kg, SpO2 97 %.Body mass  index is 18.88 kg/m.  General Appearance: Casual  Eye Contact:  Good  Speech:  Normal Rate  Volume:  Normal  Mood:  Anxious  Affect:  Congruent  Thought Process:  Coherent and Descriptions of Associations: Intact  Orientation:  Full (Time, Place, and Person)  Thought Content:  WDL and Logical  Suicidal Thoughts:  No   Homicidal Thoughts:  No  Memory:  Immediate;   Good Recent;   Good Remote;   Good  Judgement:  Fair  Insight:  Fair  Psychomotor Activity:  Decreased  Concentration:  Concentration: Good and Attention Span: Good  Recall:  Good  Fund of Knowledge:  Fair  Language:  Good  Akathisia:  No  Handed:  Right  AIMS (if indicated):     Assets:  Housing Leisure Time Physical Health Resilience Social Support  ADL's:  Intact  Cognition:  WNL  Sleep:      63 yo female admitted for sepsis and paranoia/psychosis.  History of anxiety and benzodiazepine use disorder, recently discharged from [facility] behavioral health unit.  No psychosis, suicidal/homicidal ideations, and withdrawal symptoms.  Discussed medications with her and changed Xanax to Klonopin as it is longer acting with less potential of abuse.  Also recommended an SSRI, Celexa, for better management of anxiety with future potential to wean off the benzodiazepines.  Treatment Plan Summary: Benzodiazepine use disorder: -Discontinue Xanax 1 mg every 6 hours PRN -Start Klonopin 0.5 mg daily PRN -Start Celexa 10 mg daily -Follow up with [facility] or outpatient provider  Disposition: No evidence of imminent risk to self or others at present.   Patient does not meet criteria for psychiatric inpatient admission.  Waylan Boga, NP 02/03/2019 8:44 AM   Attest to NP note

## 2019-02-04 ENCOUNTER — Other Ambulatory Visit: Payer: Self-pay

## 2019-02-04 DIAGNOSIS — F1320 Sedative, hypnotic or anxiolytic dependence, uncomplicated: Secondary | ICD-10-CM | POA: Diagnosis not present

## 2019-02-04 DIAGNOSIS — A419 Sepsis, unspecified organism: Secondary | ICD-10-CM | POA: Diagnosis not present

## 2019-02-04 LAB — URINE CULTURE: Culture: >=100000 — AB

## 2019-02-04 MED ORDER — CEPHALEXIN 250 MG PO CAPS
250.0000 mg | ORAL_CAPSULE | Freq: Four times a day (QID) | ORAL | Status: DC
  Administered 2019-02-05: 250 mg via ORAL
  Filled 2019-02-04 (×3): qty 1

## 2019-02-04 NOTE — Progress Notes (Signed)
PROGRESS NOTE  Jessica Larson  DOB: 1955-08-25  PCP: Patient, No Pcp Per [id number]  DOA: 02/01/2019  LOS: 2 days   Brief narrative: Jessica Larson is a 63 y.o. female with PMH of HTN, HLD, colon cancer, osteoporosis, anxiety, major depression, chronic benzodiazepine use. Patient was brought to the ED on 02/01/2019 by her daughter to IVC for confusion, paranoia and aggressive behavior towards the daughter.  Patient seems to have tense relation with her daughter.  Patient alleges that her daughter has been stealing her Xanax and pain medicines.  It is unclear if patient is expressing delusions or respiratory is unfortunately true. In the ED, patient had a temperature of 100.4, pulse 112. WBC count was elevated to 13.8. Urinalysis showed turbid amber-colored urine with large amount of leukocytes, nitrite positive and few bacteria. Urine drug screen was positive for benzodiazepine and tricyclic antidepressant. Urine culture showed more than 100,000 CFU per mL of gram-negative rods. With concern of UTI induced delirium, patient was admitted under hospital medicine service. Psychiatry consultation called as well.   Subjective: Patient was seen and examined this morning.  Lying down in bed.  Not in distress.  No new symptoms.  Alert, awake, oriented x3.  Sitter at bedside.  No restlessness or agitation at this time.  Assessment/Plan: E. coli UTI -Currently on IV Rocephin.  Plan to switch to oral Keflex today.  Acute metabolic encephalopathy. Major depression. Delusion and paranoia. -Has baseline psychiatric issues.  Mental status could have worsened because of UTI.   -Psychiatry consultation obtained. -Needs reassessment after UTI results. -Currently IVC. -Continue amitriptyline, Xanax stopped, Celexa started, Ativan as needed.  GERD -Continue PPI.  Essential hypertension. -Continue lisinopril.  Mobility: Encourage ambulation DVT prophylaxis:  Lovenox subcu Code Status:   Code  Status: Full Code  Family Communication:  Expected Discharge:  IVC.  Pending psychiatric clearance for discharge to home versus transfer to inpatient psych unit.  Consultants:  Psychiatry  Procedures:    Antimicrobials: Anti-infectives (From admission, onward)   Start     Dose/Rate Route Frequency Ordered Stop   02/05/19 0600  cephALEXin (KEFLEX) capsule 250 mg     250 mg Oral Every 6 hours 02/04/19 1117 02/06/19 0559   02/02/19 1000  cefTRIAXone (ROCEPHIN) 1 g in sodium chloride 0.9 % 100 mL IVPB  Status:  Discontinued     1 g 200 mL/hr over 30 Minutes Intravenous Every 24 hours 02/02/19 0738 02/04/19 1117   02/01/19 2245  ceFEPIme (MAXIPIME) 2 g in sodium chloride 0.9 % 100 mL IVPB     2 g 200 mL/hr over 30 Minutes Intravenous  Once 02/01/19 2232 02/02/19 0054   02/01/19 2245  metroNIDAZOLE (FLAGYL) IVPB 500 mg     500 mg 100 mL/hr over 60 Minutes Intravenous  Once 02/01/19 2232 02/02/19 0206   02/01/19 2245  vancomycin (VANCOCIN) IVPB 1000 mg/200 mL premix     1,000 mg 200 mL/hr over 60 Minutes Intravenous  Once 02/01/19 2232 02/02/19 0319      Diet Order            Diet regular Room service appropriate? No; Fluid consistency: Thin  Diet effective now              Infusions:  . sodium chloride 100 mL/hr at 02/04/19 0603    Scheduled Meds: . amitriptyline  50 mg Oral QHS  . atorvastatin  40 mg Oral q1800  . [START ON 02/05/2019] cephALEXin  250 mg Oral Q6H  . citalopram  10 mg Oral Daily  . enoxaparin (LOVENOX) injection  40 mg Subcutaneous Q24H  . gabapentin  300 mg Oral TID  . lisinopril  20 mg Oral Daily  . nystatin  5 mL Oral QID    PRN meds: acetaminophen **OR** acetaminophen, cyclobenzaprine, fluticasone, LORazepam, ondansetron **OR** ondansetron (ZOFRAN) IV, pantoprazole   Objective: Vitals:   02/03/19 2010 02/04/19 0604  BP: (!) 161/77 (!) 141/83  Pulse: 81 78  Resp: 18 15  Temp: 98.8 F (37.1 C) 98.3 F (36.8 C)  SpO2: 99% 100%     Intake/Output Summary (Last 24 hours) at 02/04/2019 1139 Last data filed at 02/04/2019 0908 Gross per 24 hour  Intake 3283.33 ml  Output -  Net 3283.33 ml   Filed Weights   02/01/19 2019  Weight: 49.9 kg   Weight change:  Body mass index is 18.88 kg/m.   Physical Exam: General exam: Appears calm and comfortable.  Skin: No rashes, lesions or ulcers. HEENT: Atraumatic, normocephalic, supple neck, no obvious bleeding Lungs: Clear to auscultation bilaterally CVS: Regular rate and rhythm, no murmur GI/Abd soft, nontender, nondistended, bowel sound present CNS: Alert, awake, oriented x3 Psychiatry: Appropriate mood for me.  Please see psych note for details eval. Extremities: No pedal edema, no calf tenderness  Data Review: I have personally reviewed the laboratory data and studies available.  Recent Labs  Lab 02/01/19 2028 02/03/19 0614  WBC 13.8* 7.0  HGB 13.1 11.5*  HCT 40.9 36.6  MCV 94.9 94.8  PLT 377 299   Recent Labs  Lab 02/01/19 2028 02/03/19 0614  NA 138 140  K 4.8 4.0  CL 98 105  CO2 29 27  GLUCOSE 109* 95  BUN 14 8  CREATININE 0.95 0.50  CALCIUM 9.4 8.2*    Terrilee Croak, MD  [facility] 02/04/2019

## 2019-02-05 DIAGNOSIS — N3000 Acute cystitis without hematuria: Secondary | ICD-10-CM | POA: Diagnosis not present

## 2019-02-05 DIAGNOSIS — N390 Urinary tract infection, site not specified: Secondary | ICD-10-CM | POA: Diagnosis present

## 2019-02-05 DIAGNOSIS — A419 Sepsis, unspecified organism: Secondary | ICD-10-CM | POA: Diagnosis not present

## 2019-02-05 DIAGNOSIS — F1320 Sedative, hypnotic or anxiolytic dependence, uncomplicated: Secondary | ICD-10-CM | POA: Diagnosis not present

## 2019-02-05 MED ORDER — CLONAZEPAM 0.5 MG PO TABS
0.5000 mg | ORAL_TABLET | Freq: Two times a day (BID) | ORAL | Status: DC
  Administered 2019-02-05: 0.5 mg via ORAL
  Filled 2019-02-05 (×2): qty 1

## 2019-02-05 MED ORDER — CLONAZEPAM 0.5 MG PO TABS: 0.5000 mg | ORAL_TABLET | Freq: Two times a day (BID) | ORAL | 0 refills | Status: AC

## 2019-02-05 MED ORDER — CITALOPRAM HYDROBROMIDE 10 MG PO TABS: 10.0000 mg | ORAL_TABLET | Freq: Every day | ORAL | 0 refills | Status: AC

## 2019-02-05 MED ORDER — GABAPENTIN 400 MG PO CAPS: 400.0000 mg | ORAL_CAPSULE | Freq: Three times a day (TID) | ORAL | 0 refills | Status: AC

## 2019-02-05 MED ORDER — CEPHALEXIN 500 MG PO CAPS: 500.0000 mg | ORAL_CAPSULE | Freq: Two times a day (BID) | ORAL | Status: DC

## 2019-02-05 MED ORDER — PROMETHAZINE HCL 25 MG PO TABS: 25.0000 mg | ORAL_TABLET | Freq: Three times a day (TID) | ORAL | 0 refills | Status: AC | PRN

## 2019-02-05 MED ORDER — AMITRIPTYLINE HCL 50 MG PO TABS: 50.0000 mg | ORAL_TABLET | Freq: Every day | ORAL | 0 refills | Status: AC

## 2019-02-05 MED ORDER — CEPHALEXIN 500 MG PO CAPS: 500.0000 mg | ORAL_CAPSULE | Freq: Two times a day (BID) | ORAL | 0 refills | Status: AC
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
## 2019-02-05 NOTE — Care Management Obs Status (Signed)
[facility] NOTIFICATION   Patient Details  Name: Jessica Larson MRN: [id number] Date of Birth: September 28, 1955   Medicare Observation Status Notification Given:  Yes    Beverly Sessions, RN 02/05/2019, 3:06 PM

## 2019-02-05 NOTE — Care Management Important Message (Signed)
Important Message  Patient Details  Name: Zaray Arrue MRN: [id number] Date of Birth: [birth year]   Medicare Important Message Given:  Yes     Dannette Barbara 02/05/2019, 12:28 PM
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
## 2019-02-05 NOTE — Care Management CC44 (Signed)
Condition Code 44 Documentation Completed  Patient Details  Name: Jessica Larson MRN: [id number] Date of Birth: 1956-01-16   Condition Code 44 given:  Yes Patient signature on Condition Code 44 notice:  Yes Documentation of 2 MD's agreement:  Yes Code 44 added to claim:  Yes    Beverly Sessions, RN 02/05/2019, 3:06 PM

## 2019-02-05 NOTE — Progress Notes (Signed)
Grier Mitts A and O x4. VSS. Pt tolerating diet well. No complaints of nausea or vomiting. IV removed intact, prescriptions given. Pt voices understanding of discharge instructions with no further questions. Patient discharged via wheelchair with RN  Allergies as of 02/05/2019   No Known Allergies     Medication List    STOP taking these medications   ALPRAZolam 1 MG tablet Commonly known as: XANAX     TAKE these medications   amitriptyline 50 MG tablet Commonly known as: ELAVIL Take 1 tablet (50 mg total) by mouth at bedtime for 5 days.   atorvastatin 40 MG tablet Commonly known as: LIPITOR Take 1 tablet (40 mg total) by mouth daily at 6 PM.   cephALEXin 500 MG capsule Commonly known as: KEFLEX Take 1 capsule (500 mg total) by mouth 2 (two) times daily for 3 days.   citalopram 10 MG tablet Commonly known as: CELEXA Take 1 tablet (10 mg total) by mouth daily. Start taking on: February 06, 2019   clonazePAM 0.5 MG tablet Commonly known as: KLONOPIN Take 1 tablet (0.5 mg total) by mouth 2 (two) times daily for 5 days.   cyclobenzaprine 10 MG tablet Commonly known as: FLEXERIL Take 1 tablet (10 mg total) by mouth daily as needed for muscle spasms.   fluticasone 50 MCG/ACT nasal spray Commonly known as: FLONASE Place 1 spray into both nostrils daily as needed for allergies.   gabapentin 400 MG capsule Commonly known as: NEURONTIN Take 1 capsule (400 mg total) by mouth 3 (three) times daily for 5 days. What changed: how much to take   hydrOXYzine 10 MG tablet Commonly known as: ATARAX/VISTARIL Take 10 mg by mouth 3 (three) times daily as needed.   lisinopril 20 MG tablet Commonly known as: ZESTRIL Take 1 tablet (20 mg total) by mouth daily.   pantoprazole 40 MG tablet Commonly known as: PROTONIX Take 1 tablet (40 mg total) by mouth 2 (two) times daily as needed (indigestion).   promethazine 25 MG tablet Commonly known as: PHENERGAN Take 1 tablet (25 mg total)  by mouth every 8 (eight) hours as needed for up to 5 days for nausea or vomiting. What changed: when to take this   Vitamin D3 125 MCG (5000 UT) Tbdp Take 1 tablet by mouth daily.       Vitals:   02/05/19 0815 02/05/19 0939  BP: (!) 152/76 (!) 160/85  Pulse: 71   Resp: 13   Temp: 98.4 F (36.9 C)   SpO2: 97%     Kendry Pfarr C Khaliyah [employer]rop

## 2019-02-06 LAB — CULTURE, BLOOD (ROUTINE X 2)
Culture: NO GROWTH
Culture: NO GROWTH

## 2019-02-18 DIAGNOSIS — R198 Other specified symptoms and signs involving the digestive system and abdomen: Secondary | ICD-10-CM | POA: Insufficient documentation

## 2019-02-18 NOTE — Progress Notes (Deleted)
Patient's Name: Jessica Larson  MRN: [id number]  Referring Provider: Waldon Reining, NP  DOB: [birth year]  PCP: Patient, No Pcp Per  DOS: 02/21/2019  Note by: Gaspar Cola, MD  Service setting: Ambulatory outpatient  Specialty: Interventional Pain Management  Location: [facility] (AMB) Pain Management Facility  Visit type: Initial Patient Evaluation  Patient type: New Patient   Primary Reason(s) for Visit: Encounter for initial evaluation of one or more chronic problems (new to examiner) potentially causing chronic pain, and posing a threat to normal musculoskeletal function. (Level of risk: High) CC: No chief complaint on file.  HPI  Ms. Stanfill is a 63 y.o. year old, female patient, who comes today to see [place] for the first time for an initial evaluation of her chronic pain. 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 Today she comes in for evaluation of her No chief complaint on file.  Pain Assessment: Location:     Radiating:   Onset:   Duration:   Quality:   Severity:  /10 (subjective, self-reported pain score)  Note: Reported  level is compatible with observation.                         When using our objective Pain Scale, levels between 6 and 10/10 are said to belong in an emergency room, as it progressively worsens from a 6/10, described as severely limiting, requiring emergency care not usually available at an outpatient pain management facility. At a 6/10 level, communication becomes difficult and requires great effort. Assistance to reach the emergency department may be required. Facial flushing and profuse sweating along with potentially dangerous increases in heart rate and blood pressure will be evident. Effect on ADL:   Timing:   Modifying factors:   BP:    HR:    Onset and Duration: {Hx; Onset and Duration:210120511} Cause of pain: {Hx; Cause:210120521} Severity: {Pain Severity:210120502} Timing: {Symptoms; Timing:210120501} Aggravating Factors: {Causes; Aggravating pain factors:210120507} Alleviating Factors: {Causes; Alleviating Factors:210120500} Associated Problems: {Hx; Associated problems:210120515} Quality of Pain: {Hx; Symptom quality or Descriptor:210120531} Previous Examinations or Tests: {Hx; Previous examinations or test:210120529} Previous Treatments: {Hx; Previous Treatment:210120503}  The patient comes into the clinics today for the first time for a chronic pain management evaluation. ***  Today I took the time to provide the patient with information regarding my pain practice. The patient was informed that my practice is divided into two sections: an interventional pain management section, as well as a completely separate and distinct medication management section. I explained that I have procedure days for my interventional therapies, and evaluation days for follow-ups and medication management. Because of the amount of documentation required during both, they are kept separated. This means that there is the possibility that she may be scheduled for a procedure on one day, and medication  management the next. I have also informed her that because of  staffing and facility limitations, I no longer take patients for medication management only. To illustrate the reasons for this, I gave the patient the example of surgeons, and how inappropriate it would be to refer a patient to his/her care, just to write for the post-surgical antibiotics on a surgery done by a different surgeon.   Because interventional pain management is my board-certified specialty, the patient was informed that joining my practice means that they are open to any and all interventional therapies. I made it clear that this does not mean that they will be forced to have any procedures done. What this means is that I believe interventional therapies to be essential part of the diagnosis and proper management of chronic pain conditions. Therefore, patients not interested in these interventional alternatives will be better served under the care of a different practitioner.  The patient was also made aware of my Comprehensive Pain Management Safety Guidelines where by joining my practice, they limit all of their nerve blocks and joint injections to those done by our practice, for as long as we are retained to manage their care.   Historic Controlled Substance Pharmacotherapy Review  PMP and historical list of controlled substances: ***  Highest opioid analgesic regimen found: ***  Most recent opioid analgesic: ***  Current opioid analgesics:  ***  Highest recorded MME/day: *** mg/day MME/day: *** mg/day  Medications: The patient did not bring the medication(s) to the appointment, as requested in our "New Patient Package" Pharmacodynamics: Desired effects: Analgesia: The patient reports >50% benefit. Reported improvement in function: The patient reports medication allows her to accomplish basic ADLs. Clinically meaningful improvement in function (CMIF): Sustained CMIF goals met Perceived effectiveness: Described as  relatively effective, allowing for increase in activities of daily living (ADL) Undesirable effects: Side-effects or Adverse reactions: None reported Historical Monitoring: The patient  reports no history of drug use. List of all UDS Test(s): Lab Results  Component Value Date   MDMA NONE DETECTED 02/01/2019   MDMA NONE DETECTED 12/20/2018   COCAINSCRNUR NONE DETECTED 02/01/2019   COCAINSCRNUR NONE DETECTED 12/20/2018   COCAINSCRNUR NONE DETECTED 11/14/2018   PCPSCRNUR NONE DETECTED 02/01/2019   PCPSCRNUR NONE DETECTED 12/20/2018   THCU NONE DETECTED 02/01/2019   THCU NONE DETECTED 12/20/2018   THCU NONE DETECTED 11/14/2018   ETH <10 02/01/2019   ETH <10 12/19/2018   List of other Serum/Urine Drug Screening Test(s):  Lab Results  Component Value Date   COCAINSCRNUR NONE DETECTED 02/01/2019   COCAINSCRNUR NONE DETECTED 12/20/2018   COCAINSCRNUR NONE DETECTED 11/14/2018   THCU NONE DETECTED 02/01/2019   THCU NONE DETECTED 12/20/2018   THCU NONE DETECTED 11/14/2018   ETH <10 02/01/2019   ETH <10 12/19/2018   Historical Background Evaluation: [place] PMP: PDMP reviewed during this encounter. Six (6) year initial data search conducted.             PMP NARX Score Report:  Narcotic: 490 Sedative: 591 Stimulant: 000 [place] Department of public safety, offender search: Editor, commissioning Information) Non-contributory Risk Assessment Profile: Aberrant behavior: None observed or detected today Risk factors for fatal opioid overdose: None identified today PMP NARX Overdose Risk Score: 820 Fatal overdose hazard ratio (HR): Calculation deferred Non-fatal overdose hazard ratio (HR): Calculation deferred Risk of opioid abuse or dependence: 0.7-3.0% with doses ? 36 MME/day and 6.1-26% with doses ? 120 MME/day. Substance use disorder (SUD) risk level: See below Personal History of Substance Abuse (SUD-Substance use disorder):  Alcohol:    Illegal Drugs:  Rx Drugs:    ORT Risk Level calculation:     ORT Scoring interpretation table:  Score <3 = Low Risk for SUD  Score between 4-7 = Moderate Risk for SUD  Score >8 = High Risk for Opioid Abuse   PHQ-2 Depression Scale:  Total score:    PHQ-2 Scoring interpretation table: (Score and probability of major depressive disorder)  Score 0 = No depression  Score 1 = 15.4% Probability  Score 2 = 21.1% Probability  Score 3 = 38.4% Probability  Score 4 = 45.5% Probability  Score 5 = 56.4% Probability  Score 6 = 78.6% Probability   PHQ-9 Depression Scale:  Total score:    PHQ-9 Scoring interpretation table:  Score 0-4 = No depression  Score 5-9 = Mild depression  Score 10-14 = Moderate depression  Score 15-19 = Moderately severe depression  Score 20-27 = Severe depression (2.4 times higher risk of SUD and 2.89 times higher risk of overuse)   Pharmacologic Plan: As per protocol, I have not taken over any controlled substance management, pending the results of ordered tests and/or consults.            Initial impression: Pending review of available data and ordered tests.  Meds   Current Outpatient Medications:  .  amitriptyline (ELAVIL) 50 MG tablet, Take 1 tablet (50 mg total) by mouth at bedtime for 5 days., Disp: 5 tablet, Rfl: 0 .  atorvastatin (LIPITOR) 40 MG tablet, Take 1 tablet (40 mg total) by mouth daily at 6 PM., Disp: 30 tablet, Rfl: 1 .  Cholecalciferol (VITAMIN D3) 125 MCG (5000 UT) TBDP, Take 1 tablet by mouth daily., Disp: 30 tablet, Rfl: 1 .  citalopram (CELEXA) 10 MG tablet, Take 1 tablet (10 mg total) by mouth daily., Disp: 30 tablet, Rfl: 0 .  clonazePAM (KLONOPIN) 0.5 MG tablet, Take 1 tablet (0.5 mg total) by mouth 2 (two) times daily for 5 days., Disp: 10 tablet, Rfl: 0 .  cyclobenzaprine (FLEXERIL) 10 MG tablet, Take 1 tablet (10 mg total) by mouth daily as needed for muscle spasms., Disp: 30 tablet, Rfl: 1 .  fluticasone (FLONASE) 50 MCG/ACT nasal spray, Place 1 spray into both nostrils daily as needed for  allergies., Disp: 16 g, Rfl: 1 .  gabapentin (NEURONTIN) 400 MG capsule, Take 1 capsule (400 mg total) by mouth 3 (three) times daily for 5 days., Disp: 15 capsule, Rfl: 0 .  hydrOXYzine (ATARAX/VISTARIL) 10 MG tablet, Take 10 mg by mouth 3 (three) times daily as needed., Disp: , Rfl:  .  lisinopril (ZESTRIL) 20 MG tablet, Take 1 tablet (20 mg total) by mouth daily., Disp: 30 tablet, Rfl: 1 .  pantoprazole (PROTONIX) 40 MG tablet, Take 1 tablet (40 mg total) by mouth 2 (two) times daily as needed (indigestion)., Disp: 30 tablet, Rfl: 1 .  promethazine (PHENERGAN) 25 MG tablet, Take 1 tablet (25 mg total) by mouth every 8 (eight) hours as needed for up to 5 days for nausea or vomiting., Disp: 15 tablet, Rfl: 0  Imaging Review  Cervical Imaging: Cervical CT wo contrast:  Results for orders placed during the hospital encounter of 12/08/18  CT Cervical Spine Wo Contrast   Narrative CLINICAL DATA:  Trip and fall down stairs.  EXAM: CT HEAD WITHOUT CONTRAST  CT CERVICAL SPINE WITHOUT CONTRAST  TECHNIQUE: Multidetector CT imaging of the head and cervical spine was performed following the standard protocol without intravenous contrast. Multiplanar CT image reconstructions of the cervical spine were also  generated.  COMPARISON:  None.  FINDINGS: CT HEAD FINDINGS  Brain: Punctate hyperdensity in the left basal ganglia most consistent with incidental mineralization. No intracranial hemorrhage, mass effect, or midline shift. No hydrocephalus. The basilar cisterns are patent. No evidence of territorial infarct or acute ischemia. No extra-axial or intracranial fluid collection.  Vascular: No hyperdense vessel.  Skull: No fracture or focal lesion.  Sinuses/Orbits: Paranasal sinuses and mastoid air cells are clear. The visualized orbits are unremarkable.  Other: None.  CT CERVICAL SPINE FINDINGS  Alignment: Straightening of normal lordosis. No traumatic subluxation. Chest  anterolisthesis is T1 on T2 and C7 on T1 is likely degenerative.  Skull base and vertebrae: No acute fracture. Vertebral body heights are maintained. The dens and skull base are intact.  Soft tissues and spinal canal: No prevertebral fluid or swelling. No visible canal hematoma.  Disc levels: Disc space narrowing and endplate spurring seen from C4-C5 through C6-C7. Scattered facet hypertrophy.  Upper chest: Negative.  Other: None.  IMPRESSION: 1. No acute intracranial abnormality. No skull fracture. 2. Degenerative change in the cervical spine without acute fracture or subluxation.   Electronically Signed   By: Keith Rake M.D.   On: 12/08/2018 19:57    Lumbosacral Imaging: Lumbar DG 2-3 views:  Results for orders placed during the hospital encounter of 12/08/18  DG Lumbar Spine 2-3 Views   Narrative CLINICAL DATA:  Fall  EXAM: LUMBAR SPINE - 2-3 VIEW  COMPARISON:  None.  FINDINGS: Rudimentary ribs are noted at the T12 level. Five non-rib-bearing lumbar type vertebral bodies are noted. Mild levoconvex curvature of the lumbar spine centered at L3. multilevel discogenic and facet degenerative changes are present most pronounced in the lower lumbar levels. No visible fracture, vertebral body height loss or traumatic listhesis. Atherosclerotic calcification is noted in the aorta. Lung bases are clear. Bowel gas pattern is nonobstructive. Vascular calcifications are noted in the region of the spleen.  IMPRESSION: No acute fracture or traumatic listhesis.  Levoconvex curvature of the spine. Multilevel degenerative changes most pronounced in the lower lumbar levels.  Please note: Spine radiography has limited sensitivity and specificity in the setting of significant trauma. If there is significant mechanism, recommend low threshold for CT imaging.   Electronically Signed   By: Lovena Le M.D.   On: 12/08/2018 19:46    Wrist Imaging: Wrist-R DG  Complete:  Results for orders placed during the hospital encounter of 09/15/17  DG Wrist Complete Right   Narrative CLINICAL DATA:  Initial evaluation for acute pain at snuffbox status post recent fall.  EXAM: RIGHT WRIST - COMPLETE 3+ VIEW  COMPARISON:  None.  FINDINGS: No acute fracture or dislocation. Normal radiocarpal and distal radioulnar articulations maintained. Corticated osseous fragment at the ulnar styloid is chronic in appearance. Scaphoid intact. No acute soft tissue abnormality. Bones are mildly osteopenic in appearance.  IMPRESSION: No acute osseous abnormality about the right wrist.   Electronically Signed   By: Jeannine Boga M.D.   On: 09/15/2017 16:55    Complexity Note: Imaging results reviewed. Results shared with Ms. Arntz, using Layman's terms.                         ROS  Cardiovascular: {Hx; Cardiovascular History:210120525} Pulmonary or Respiratory: {Hx; Pumonary and/or Respiratory History:210120523} Neurological: {Hx; Neurological:210120504} Review of Past Neurological Studies:  Results for orders placed or performed during the hospital encounter of 02/01/19  CT HEAD WO CONTRAST   Narrative  CLINICAL DATA:  Confusion with agitation and hallucinations  EXAM: CT HEAD WITHOUT CONTRAST  TECHNIQUE: Contiguous axial images were obtained from the base of the skull through the vertex without intravenous contrast.  COMPARISON:  December 08, 2018  FINDINGS: Brain: Ventricles are normal in size and configuration. There is no intracranial mass, hemorrhage, extra-axial fluid collection, or midline shift. Brain parenchyma appears unremarkable. No evident acute infarct.  Vascular: There is no hyperdense vessel. There is calcification in each carotid siphon region.  Skull: The bony calvarium appears intact.  Sinuses/Orbits: Visualized paranasal sinuses are clear. There is a concha bullosa on each side, an anatomic variant. Visualized  orbits appear symmetric bilaterally.  Other: The mastoid air cells are clear.  IMPRESSION: There are foci of arterial vascular calcification. Study otherwise unremarkable. Brain parenchyma appears normal.   Electronically Signed   By: Lowella Grip III M.D.   On: 02/02/2019 08:49    Psychological-Psychiatric: {Hx; Psychological-Psychiatric History:210120512} Gastrointestinal: {Hx; Gastrointestinal:210120527} Genitourinary: {Hx; Genitourinary:210120506} Hematological: {Hx; Hematological:210120510} Endocrine: {Hx; Endocrine history:210120509} Rheumatologic: {Hx; Rheumatological:210120530} Musculoskeletal: {Hx; Musculoskeletal:210120528} Work History: {Hx; Work history:210120514}  Allergies  Ms. Sparger has No Known Allergies.  Laboratory Chemistry Profile   Screening Lab Results  Component Value Date   SARSCOV2NAA NEGATIVE 02/02/2019   HIV Non Reactive 11/14/2018    Inflammation (CRP: Acute Phase) (ESR: Chronic Phase) Lab Results  Component Value Date   LATICACIDVEN 1.1 02/01/2019                         Rheumatology No results found.  Renal Lab Results  Component Value Date   BUN 8 02/03/2019   CREATININE 0.50 02/03/2019   GFRAA >60 02/03/2019   GFRNONAA >60 02/03/2019                             Hepatic Lab Results  Component Value Date   AST 11 (L) 02/03/2019   ALT 7 02/03/2019   ALBUMIN 3.1 (L) 02/03/2019   ALKPHOS 50 02/03/2019   LIPASE 16 12/18/2018                        Electrolytes Lab Results  Component Value Date   NA 140 02/03/2019   K 4.0 02/03/2019   CL 105 02/03/2019   CALCIUM 8.2 (L) 02/03/2019                        Neuropathy Lab Results  Component Value Date   HIV Non Reactive 11/14/2018                        CNS No results found.  Bone No results found.  Coagulation Lab Results  Component Value Date   APTT 33 02/01/2019   PLT 299 02/03/2019                        Cardiovascular Lab Results  Component  Value Date   HGB 11.5 (L) 02/03/2019   HCT 36.6 02/03/2019                         ID Lab Results  Component Value Date   HIV Non Reactive 11/14/2018   [place] NEGATIVE 02/02/2019    Cancer No results found.  Endocrine No results found.  Note: Lab results reviewed.  [place]  Drug: Ms. Deyo  reports no history of drug use. Alcohol:  reports previous alcohol use. Tobacco:  reports that she quit smoking about 6 years ago. She quit after 0.00 years of use. She has never used smokeless tobacco. 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 Family: family history is not on file.  Past Surgical History:  Procedure Laterality Date  . COLON SURGERY     Active Ambulatory Problems    Diagnosis Date Noted  . Nausea & vomiting 11/14/2018  . Chronic back pain 11/14/2018  . Essential hypertension 07/05/2011  . Pulmonary nodule 11/14/2018  . Nausea and vomiting 11/15/2018  . Anxiety 12/20/2018  . Substance abuse ([place]) 12/20/2018  . MDD (major depressive disorder), recurrent episode, severe ([place]) 12/20/2018  . Adjustment disorder with mixed disturbance of emotions and conduct 12/21/2018  . Sepsis ([place]) 02/02/2019  . Benzodiazepine dependence ([place]) 10/06/2018  . Urinary tract infection 02/05/2019  . Adjustment disorder with anxious mood 01/31/2019  . 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 Age-related osteoporosis without current pathological fracture 02/25/2016  . Anal condylomata 07/06/2011  . ASCUS (atypical squamous cells of undetermined significance) on Pap smear 07/05/2011  . Balance disorder 02/13/2015  . Chronic prescription benzodiazepine  use 02/27/2018  . Diarrhea 01/22/2013  . Fecal incontinence 02/13/2015  . GAD (generalized anxiety disorder) 10/06/2018  . Gastrointestinal symptoms 02/18/2019  . Hematuria 08/18/2012  . Hypercholesterolemia 07/05/2011  . Left wrist fracture 07/05/2011  . Parent-child conflict 68/01/5725  . Precordial pain 11/29/2014  . Primary insomnia 01/31/2019  . Right hand pain 11/03/2015  . Shingles 07/29/2011  . Syncope 08/08/2014  . Tobacco abuse 05/11/2013  . Chronic pain 09/14/2011   Resolved Ambulatory Problems    Diagnosis Date Noted  . MDD (major depressive disorder), severe ([place]) 12/20/2018   Past Medical History:  Diagnosis Date  . Abnormal gait   . Atypical squamous cells of undetermined significance (ASCUS) on Papanicolaou smear of cervix   . Cancer ([place])   . Colon cancer ([place])   . High cholesterol   . History of loop recorder   . History of shingles   . Hypertension   . Osteoporosis   . Tremor    Constitutional Exam  General appearance: Well nourished, well developed, and well hydrated. In no apparent acute distress There were no vitals filed for this visit. BMI Assessment: Estimated body mass index is 18.88 kg/m as calculated from the following:   Height as of 12/20/18: '5\' 4"'  (1.626 m).   Weight as of 02/01/19: 110 lb (49.9 kg).  BMI interpretation table: BMI level Category Range association with higher incidence of chronic pain  <18 kg/m2 Underweight   18.5-24.9 kg/m2 Ideal body weight   25-29.9 kg/m2 Overweight Increased incidence by 20%  30-34.9 kg/m2 Obese (Class I) Increased incidence by 68%  35-39.9 kg/m2 Severe obesity (Class II) Increased incidence by 136%  >40 kg/m2 Extreme obesity (Class III) Increased incidence by 254%   Patient's current BMI Ideal Body weight  There is no height or weight on file to calculate BMI. Patient weight not recorded   BMI Readings from Last 4 Encounters:  02/01/19 18.88 kg/m  12/19/18 24.90 kg/m  12/18/18 24.89 kg/m   12/08/18 25.75 kg/m   Wt Readings from  Last 4 Encounters:  02/01/19 110 lb (49.9 kg)  12/19/18 145 lb 1 oz (65.8 kg)  12/18/18 145 lb (65.8 kg)  12/08/18 150 lb (68 kg)  Psych/Mental status: Alert, oriented x 3 (person, place, & time)       Eyes: PERLA Respiratory: No evidence of acute respiratory distress  Cervical Spine Area Exam  Skin & Axial Inspection: No masses, redness, edema, swelling, or associated skin lesions Alignment: Symmetrical Functional ROM: Unrestricted ROM      Stability: No instability detected Muscle Tone/Strength: Functionally intact. No obvious neuro-muscular anomalies detected. Sensory (Neurological): Unimpaired Palpation: No palpable anomalies              Upper Extremity (UE) Exam    Side: Right upper extremity  Side: Left upper extremity  Skin & Extremity Inspection: Skin color, temperature, and hair growth are WNL. No peripheral edema or cyanosis. No masses, redness, swelling, asymmetry, or associated skin lesions. No contractures.  Skin & Extremity Inspection: Skin color, temperature, and hair growth are WNL. No peripheral edema or cyanosis. No masses, redness, swelling, asymmetry, or associated skin lesions. No contractures.  Functional ROM: Unrestricted ROM          Functional ROM: Unrestricted ROM          Muscle Tone/Strength: Functionally intact. No obvious neuro-muscular anomalies detected.  Muscle Tone/Strength: Functionally intact. No obvious neuro-muscular anomalies detected.  Sensory (Neurological): Unimpaired          Sensory (Neurological): Unimpaired          Palpation: No palpable anomalies              Palpation: No palpable anomalies              Provocative Test(s):  Phalen's test: deferred Tinel's test: deferred Apley's scratch test (touch opposite shoulder):  Action 1 (Across chest): deferred Action 2 (Overhead): deferred Action 3 (LB reach): deferred   Provocative Test(s):  Phalen's test: deferred Tinel's test: deferred Apley's  scratch test (touch opposite shoulder):  Action 1 (Across chest): deferred Action 2 (Overhead): deferred Action 3 (LB reach): deferred    Thoracic Spine Area Exam  Skin & Axial Inspection: No masses, redness, or swelling Alignment: Symmetrical Functional ROM: Unrestricted ROM Stability: No instability detected Muscle Tone/Strength: Functionally intact. No obvious neuro-muscular anomalies detected. Sensory (Neurological): Unimpaired Muscle strength & Tone: No palpable anomalies  Lumbar Spine Area Exam  Skin & Axial Inspection: No masses, redness, or swelling Alignment: Symmetrical Functional ROM: Unrestricted ROM       Stability: No instability detected Muscle Tone/Strength: Functionally intact. No obvious neuro-muscular anomalies detected. Sensory (Neurological): Unimpaired Palpation: No palpable anomalies       Provocative Tests: Hyperextension/rotation test: deferred today       Lumbar quadrant test (Kemp's test): deferred today       Lateral bending test: deferred today       Patrick's Maneuver: deferred today                   FABER* test: deferred today                   S-I anterior distraction/compression test: deferred today         S-I lateral compression test: deferred today         S-I Thigh-thrust test: deferred today         S-I Gaenslen's test: deferred today         *(Flexion, ABduction and External Rotation)  Gait &  Posture Assessment  Ambulation: Unassisted Gait: Relatively normal for age and body habitus Posture: WNL   Lower Extremity Exam    Side: Right lower extremity  Side: Left lower extremity  Stability: No instability observed          Stability: No instability observed          Skin & Extremity Inspection: Skin color, temperature, and hair growth are WNL. No peripheral edema or cyanosis. No masses, redness, swelling, asymmetry, or associated skin lesions. No contractures.  Skin & Extremity Inspection: Skin color, temperature, and hair growth are  WNL. No peripheral edema or cyanosis. No masses, redness, swelling, asymmetry, or associated skin lesions. No contractures.  Functional ROM: Unrestricted ROM                  Functional ROM: Unrestricted ROM                  Muscle Tone/Strength: Functionally intact. No obvious neuro-muscular anomalies detected.  Muscle Tone/Strength: Functionally intact. No obvious neuro-muscular anomalies detected.  Sensory (Neurological): Unimpaired        Sensory (Neurological): Unimpaired        DTR: Patellar: deferred today Achilles: deferred today Plantar: deferred today  DTR: Patellar: deferred today Achilles: deferred today Plantar: deferred today  Palpation: No palpable anomalies  Palpation: No palpable anomalies   Assessment  Primary Diagnosis & Pertinent Problem List: There were no encounter diagnoses.  Visit Diagnosis (New problems to examiner): No diagnosis found. Plan of Care (Initial workup plan)  Note: Ms. Brazzle was reminded that as per protocol, today's visit has been an evaluation only. We have not taken over the patient's controlled substance management.  Problem-specific plan: No problem-specific Assessment & Plan notes found for this encounter.  Lab Orders  No laboratory test(s) ordered today   Imaging Orders  No imaging studies ordered today   Referral Orders  No referral(s) requested today   Procedure Orders    No procedure(s) ordered today   Pharmacotherapy (current): Medications ordered:  No orders of the defined types were placed in this encounter.  Medications administered during this visit: Grier Mitts had no medications administered during this visit.   Pharmacological management options:  Opioid Analgesics: The patient was informed that there is no guarantee that she would be a candidate for opioid analgesics. The decision will be made following CDC guidelines. This decision will be based on the results of diagnostic studies, as well as Ms. Stoklosa's  risk profile.   Membrane stabilizer: To be determined at a later time  Muscle relaxant: To be determined at a later time  NSAID: To be determined at a later time  Other analgesic(s): To be determined at a later time   Interventional management options: Ms. Leisner was informed that there is no guarantee that she would be a candidate for interventional therapies. The decision will be based on the results of diagnostic studies, as well as Ms. Lacaze's risk profile.  Procedure(s) under consideration:  ***   Provider-requested follow-up: No follow-ups on file.  Future Appointments  Date Time Provider [place]  02/21/2019  8:30 AM Milinda Pointer, MD [facility] None    Primary Care Physician: Patient, No Pcp Per Location: [facility] Note by: Gaspar Cola, MD Date: 02/21/2019; Time: 5:36 PM  Note: This dictation was prepared with Dragon dictation. Any transcriptional errors that may result from this process are unintentional.

## 2019-02-20 DIAGNOSIS — F99 Mental disorder, not otherwise specified: Secondary | ICD-10-CM | POA: Insufficient documentation

## 2019-02-20 DIAGNOSIS — M4180 Other forms of scoliosis, site unspecified: Secondary | ICD-10-CM | POA: Insufficient documentation

## 2019-02-20 DIAGNOSIS — M899 Disorder of bone, unspecified: Secondary | ICD-10-CM | POA: Insufficient documentation

## 2019-02-20 DIAGNOSIS — M47812 Spondylosis without myelopathy or radiculopathy, cervical region: Secondary | ICD-10-CM | POA: Insufficient documentation

## 2019-02-20 DIAGNOSIS — Z789 Other specified health status: Secondary | ICD-10-CM | POA: Insufficient documentation

## 2019-02-20 DIAGNOSIS — Z79899 Other long term (current) drug therapy: Secondary | ICD-10-CM | POA: Insufficient documentation

## 2019-02-20 DIAGNOSIS — M5136 Other intervertebral disc degeneration, lumbar region: Secondary | ICD-10-CM | POA: Insufficient documentation

## 2019-02-20 DIAGNOSIS — M5030 Other cervical disc degeneration, unspecified cervical region: Secondary | ICD-10-CM | POA: Insufficient documentation

## 2019-02-20 DIAGNOSIS — R45851 Suicidal ideations: Secondary | ICD-10-CM | POA: Insufficient documentation

## 2019-02-20 DIAGNOSIS — Z91410 Personal history of adult physical and sexual abuse: Secondary | ICD-10-CM | POA: Insufficient documentation

## 2019-02-21 ENCOUNTER — Ambulatory Visit: Payer: Medicare HMO | Admitting: Pain Medicine

## 2019-03-31 ENCOUNTER — Other Ambulatory Visit: Payer: Self-pay | Admitting: Psychiatry

## 2019-04-30 DEATH — deceased

## 2020-12-19 IMAGING — CT CT HEAD W/O CM
5 of 7 series · 17 of 47 positions shown, 18 images · non-contrast
Comparison: None.

CLINICAL DATA: Trip and fall down stairs.

EXAM:
CT HEAD WITHOUT CONTRAST
CT CERVICAL SPINE WITHOUT CONTRAST
TECHNIQUE: Multidetector CT imaging of the head and cervical spine was
performed following the standard protocol without intravenous
contrast. Multiplanar CT image reconstructions of the cervical spine
were also generated.

[Series 3: head wo · axial · 0.42mm/px · z∈[-67,-17]mm · 2 of 31 slices shown, 3 images]
[im 11/31  brain]
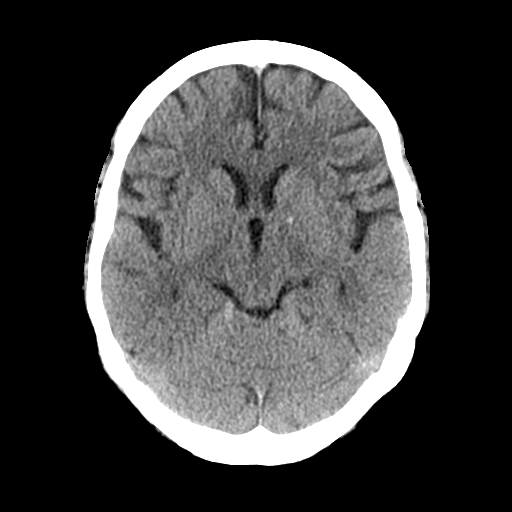
[im 11/31  bone]
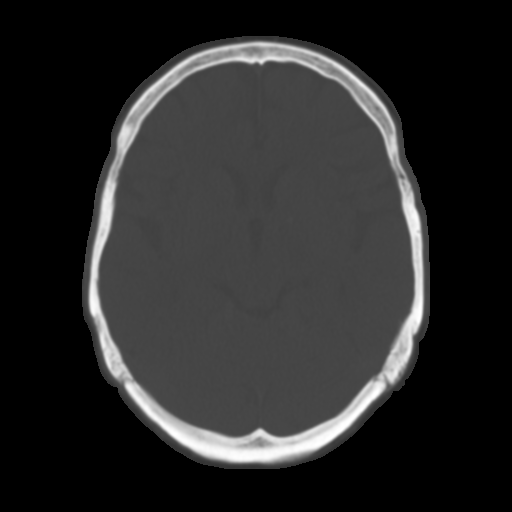
[im 21/31  brain]
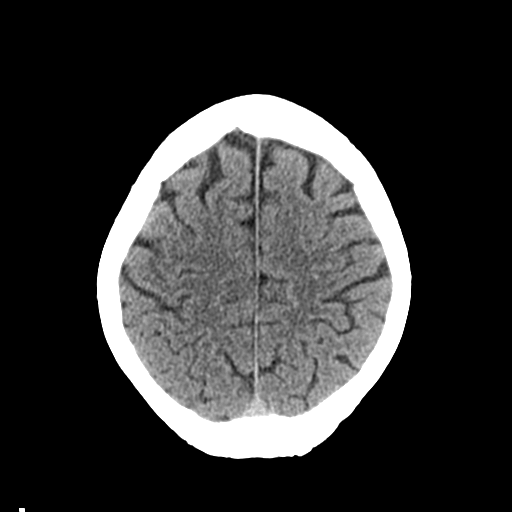

[Series 4: coronal soft tissue · coronal · 0.33mm/px · 3 of 64 slices shown]
[im 13/64  brain]
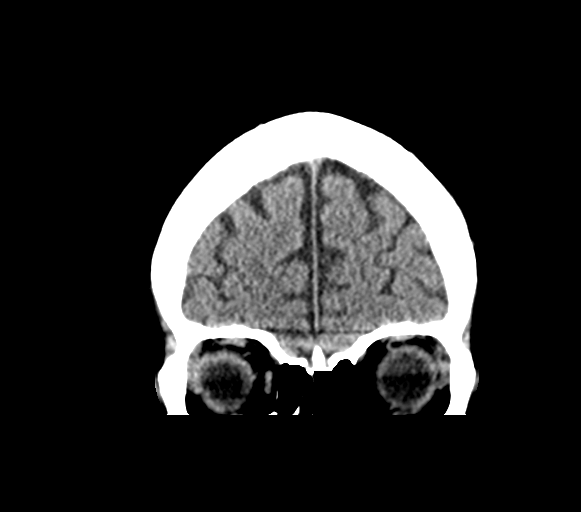
[im 26/64  brain]
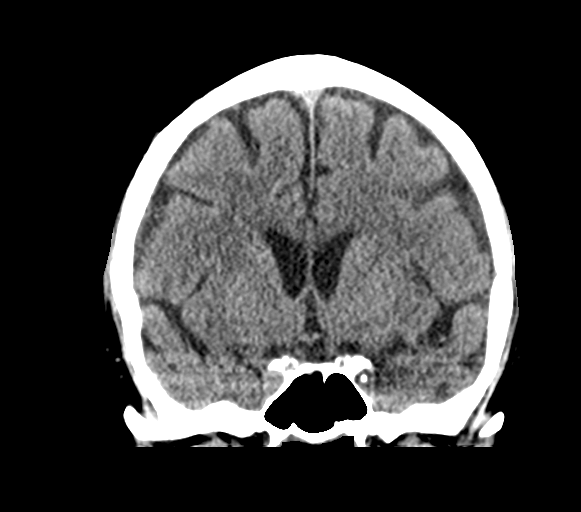
[im 38/64  brain]
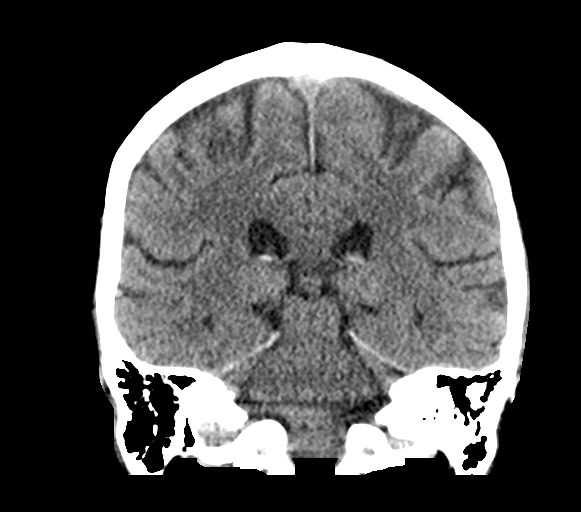

[Series 5: sagittal soft tissue · sagittal · 0.36mm/px · 1 of 53 slices shown]
[im 27/53  brain]
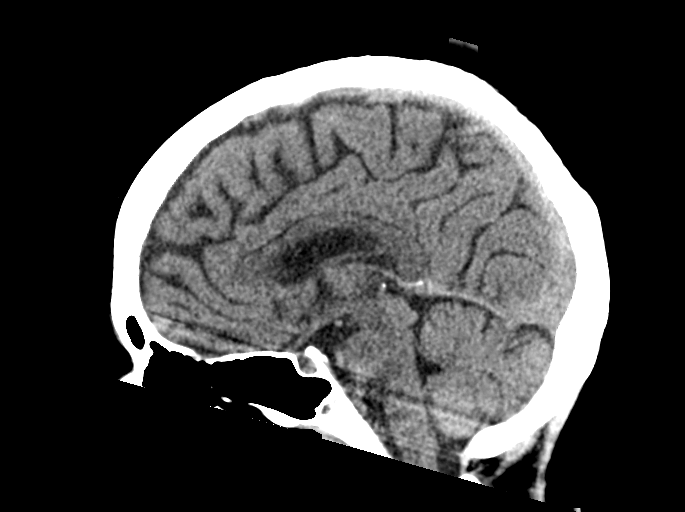

[Series 7: c spine soft · axial · 0.32mm/px · z∈[-264,-214]mm · 3 of 92 slices shown]
[im 9/92  brain]
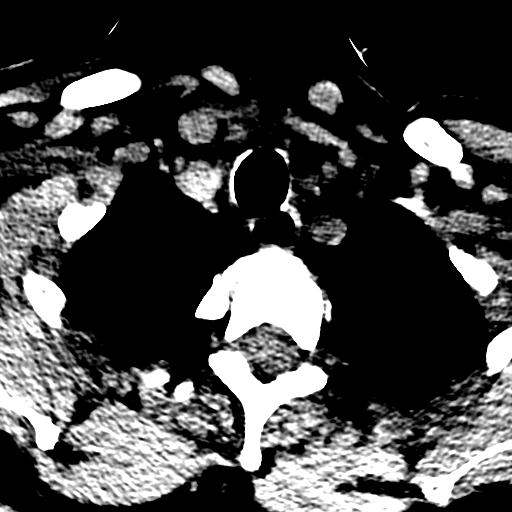
[im 17/92  brain]
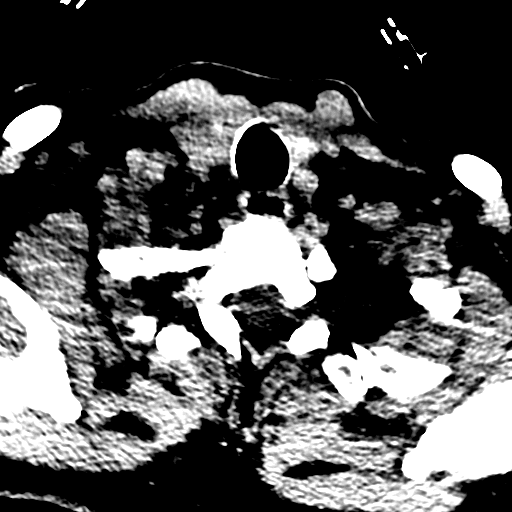
[im 34/92  brain]
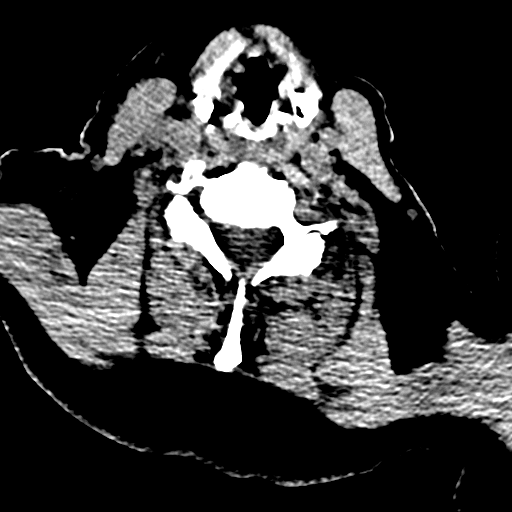

[Series 12: orthogonal bone · axial · 0.23mm/px · z∈[-310,-155]mm · 8 of 104 slices shown]
[im 8/104  bone]
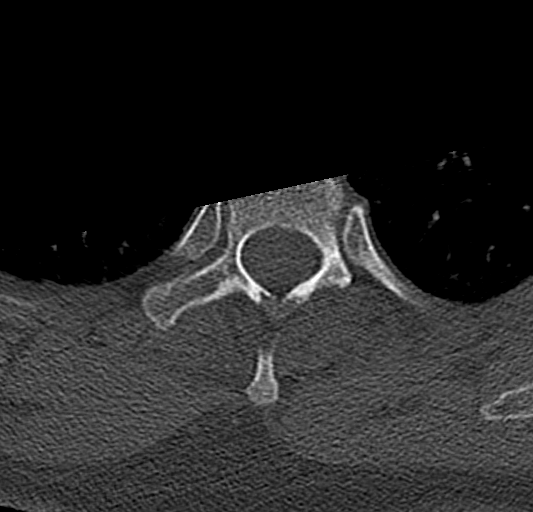
[im 24/104  bone]
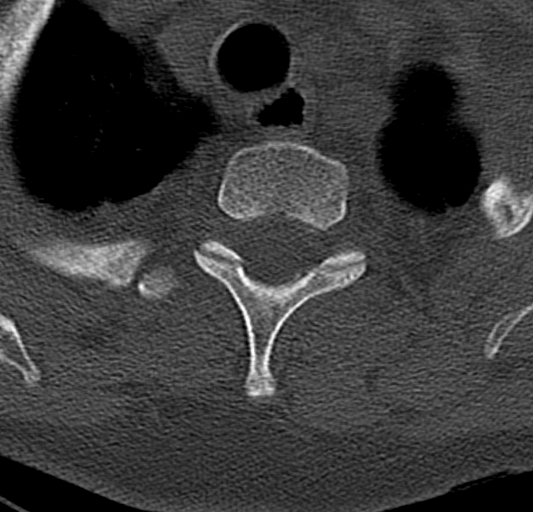
[im 32/104  bone]
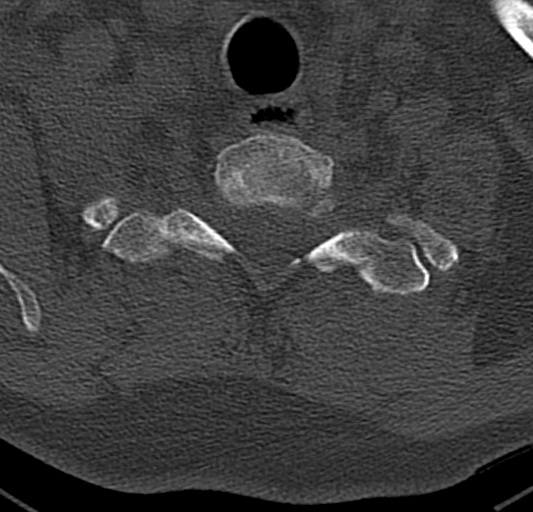
[im 48/104  bone]
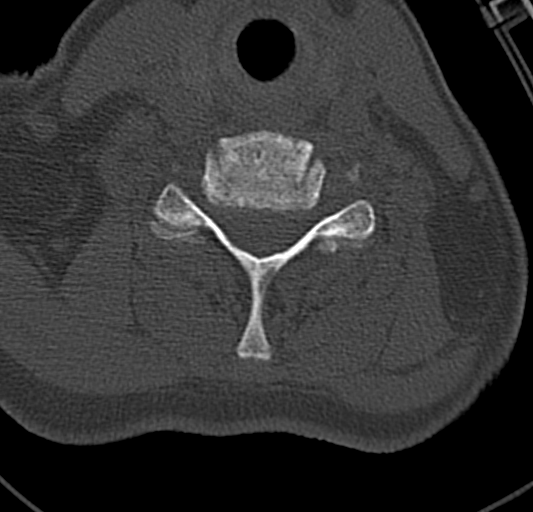
[im 56/104  bone]
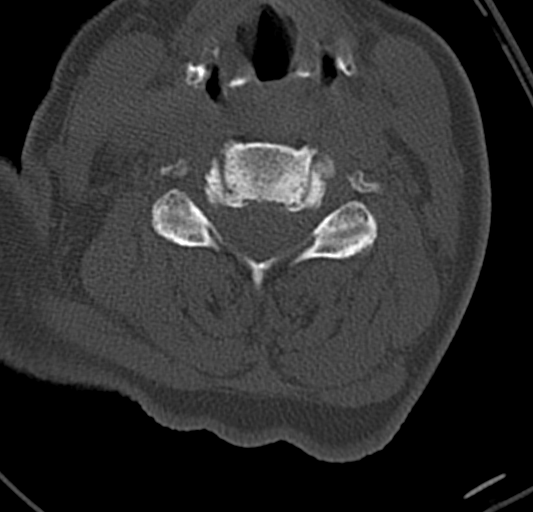
[im 72/104  bone]
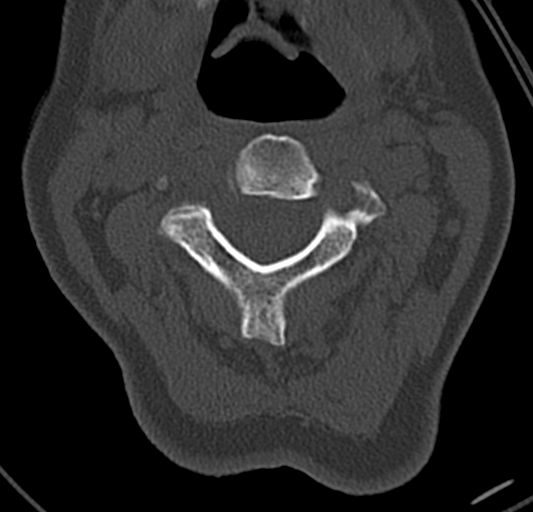
[im 80/104  bone]
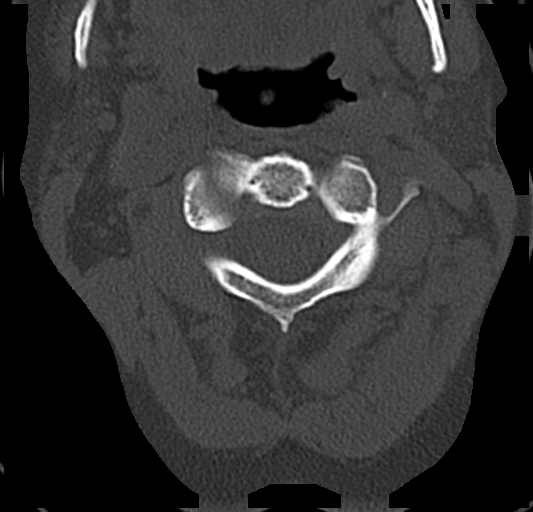
[im 96/104  bone]
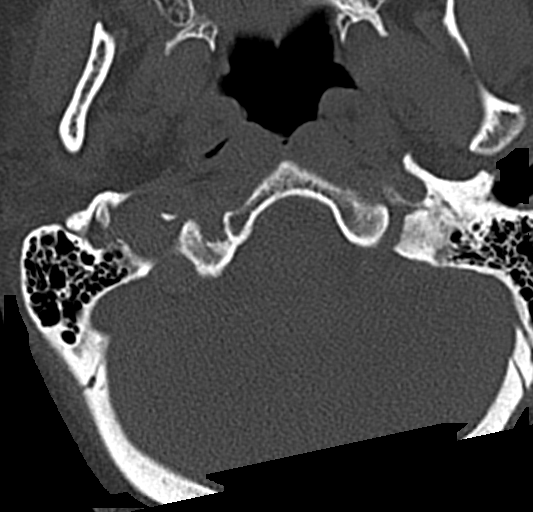

[17 of 47 positions shown; findings below may reference images not displayed]

FINDINGS: CT HEAD FINDINGS

Brain: Punctate hyperdensity in the left basal ganglia most
consistent with incidental mineralization. No intracranial
hemorrhage, mass effect, or midline shift. No hydrocephalus. The
basilar cisterns are patent. No evidence of territorial infarct or
acute ischemia. No extra-axial or intracranial fluid collection.

Vascular: No hyperdense vessel.

Skull: No fracture or focal lesion.

Sinuses/Orbits: Paranasal sinuses and mastoid air cells are clear.
The visualized orbits are unremarkable.

Other: None.

CT CERVICAL SPINE FINDINGS

Alignment: Straightening of normal lordosis. No traumatic
subluxation. Chest anterolisthesis is T1 on T2 and C7 on T1 is
likely degenerative.

Skull base and vertebrae: No acute fracture. Vertebral body heights
are maintained. The dens and skull base are intact.

Soft tissues and spinal canal: No prevertebral fluid or swelling. No
visible canal hematoma.

Disc levels: Disc space narrowing and endplate spurring seen from
C4-C5 through C6-C7. Scattered facet hypertrophy.

Upper chest: Negative.

Other: None.
IMPRESSION: 1. No acute intracranial abnormality. No skull fracture.
2. Degenerative change in the cervical spine without acute fracture
or subluxation.

## 2020-12-19 IMAGING — CT CT CERVICAL SPINE W/O CM
4 of 7 series · 13 of 33 positions shown, 14 images · non-contrast
Comparison: None.

CLINICAL DATA: Trip and fall down stairs.

EXAM:
CT HEAD WITHOUT CONTRAST
CT CERVICAL SPINE WITHOUT CONTRAST
TECHNIQUE: Multidetector CT imaging of the head and cervical spine was
performed following the standard protocol without intravenous
contrast. Multiplanar CT image reconstructions of the cervical spine
were also generated.

[Series 7: c spine soft · axial · 0.32mm/px · z∈[-244,-172]mm · 3 of 92 slices shown]
[im 19/92  soft-tissue]
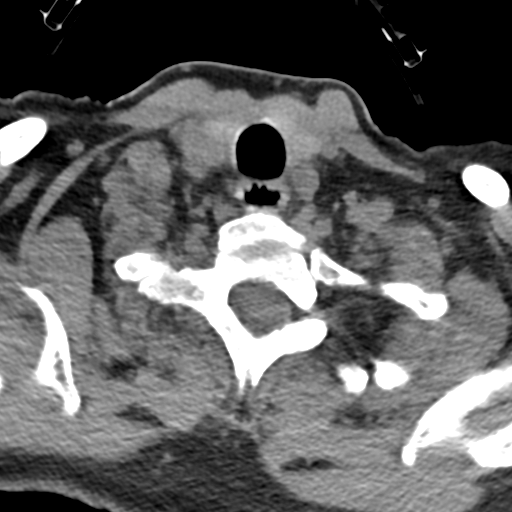
[im 37/92  soft-tissue]
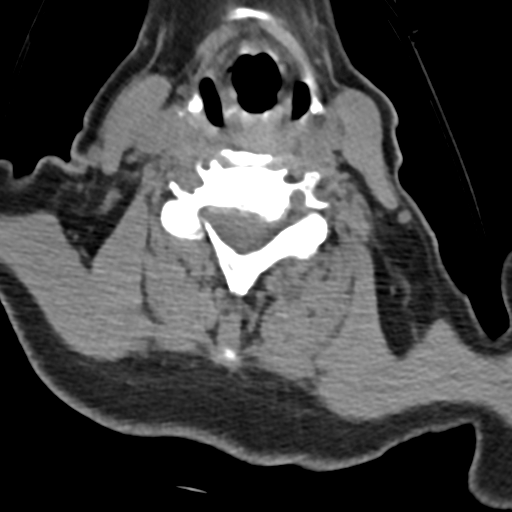
[im 55/92  soft-tissue]
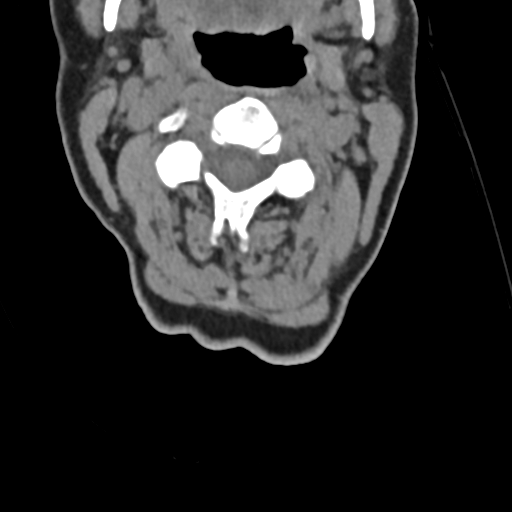

[Series 10: sagittal bone · sagittal · 0.23mm/px · 5 of 61 slices shown]
[im 11/61  bone]
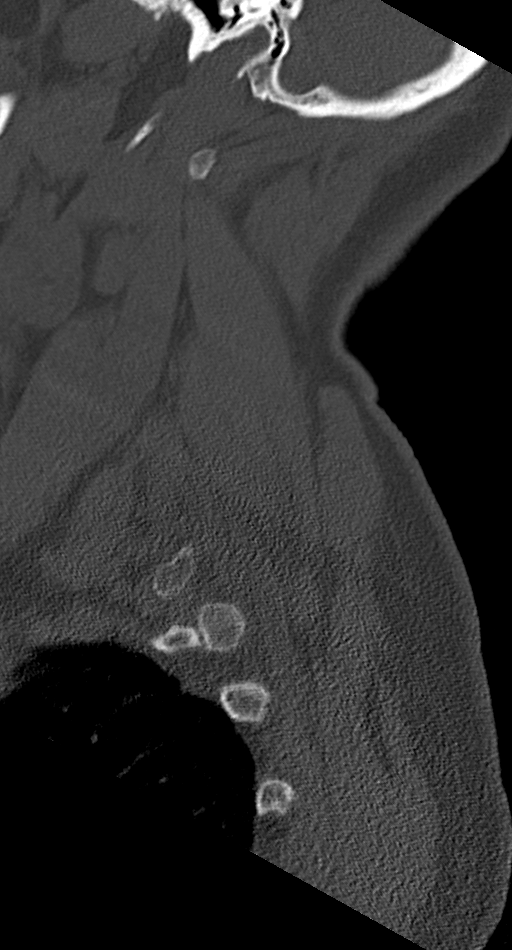
[im 21/61  bone]
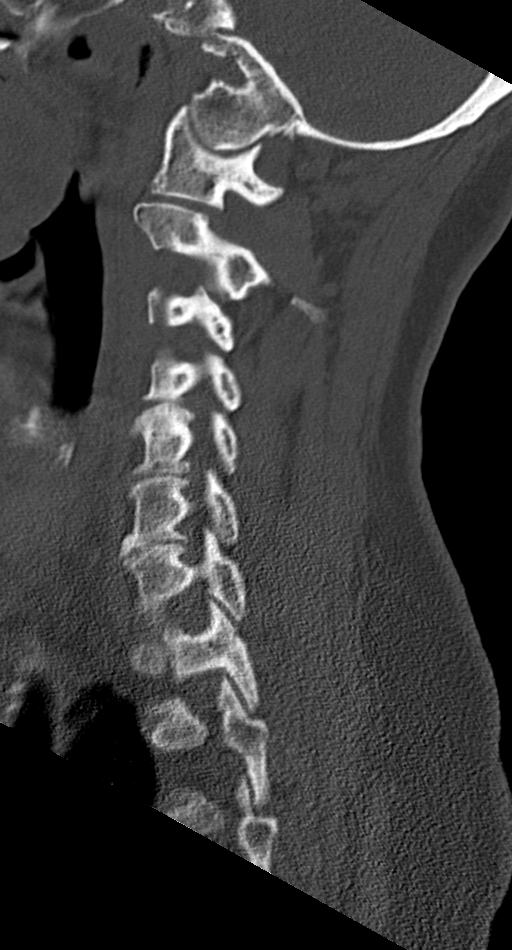
[im 31/61  bone]
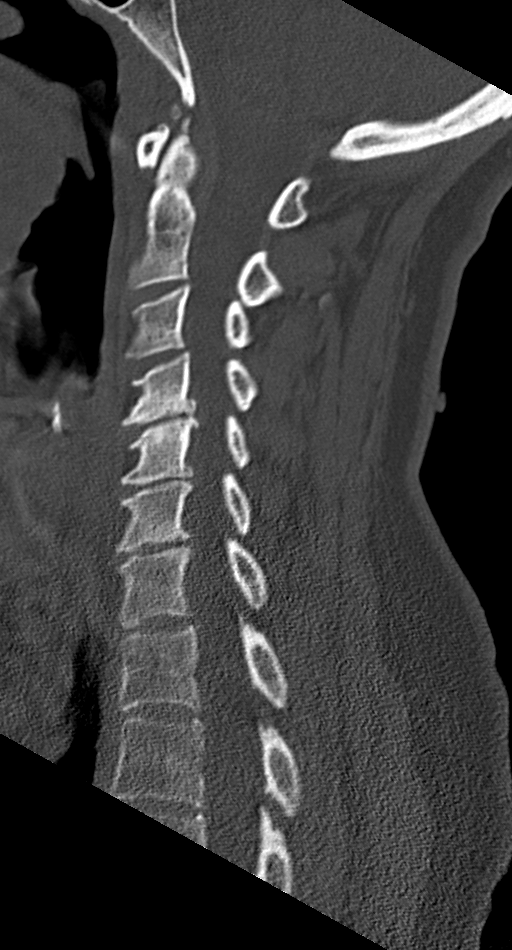
[im 41/61  bone]
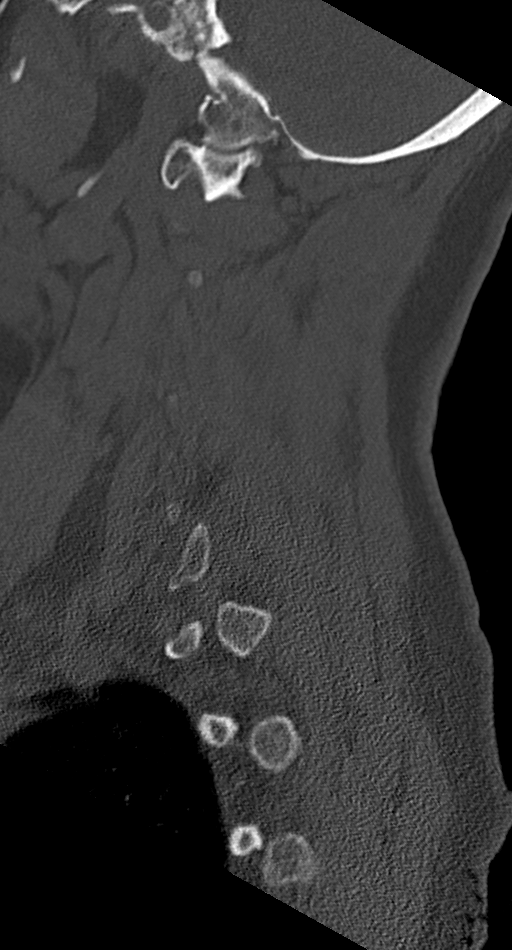
[im 51/61  bone]
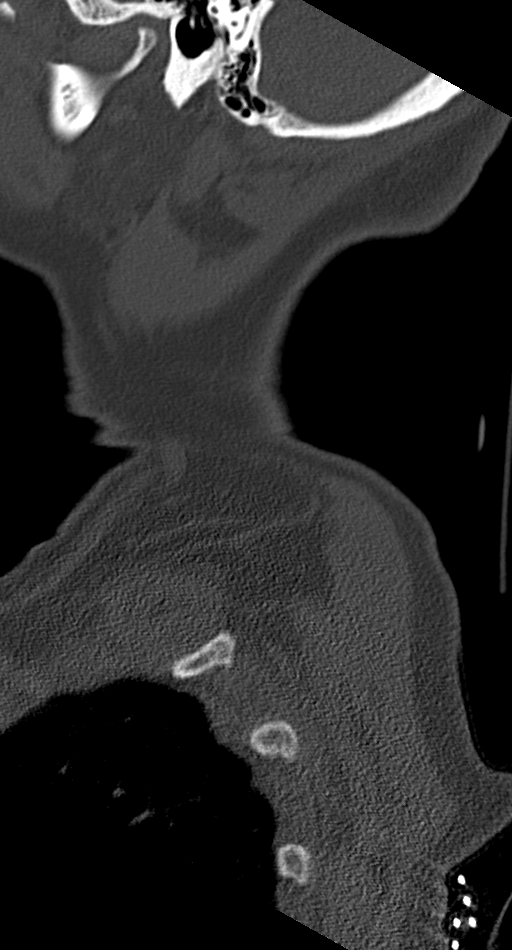

[Series 11: coronal bone · coronal · 0.24mm/px · 1 of 46 slices shown]
[im 23/46  bone]
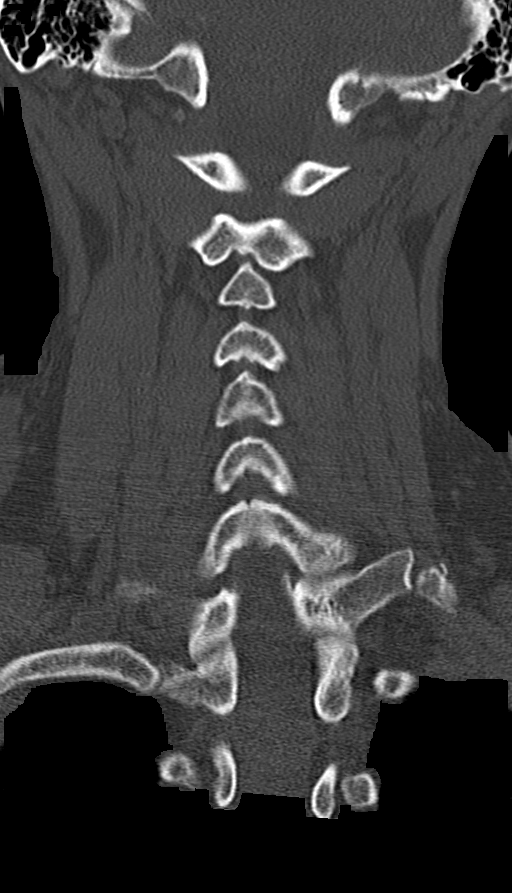

[Series 12: orthogonal bone · axial · 0.23mm/px · z∈[-287,-178]mm · 4 of 104 slices shown, 5 images]
[im 21/104  soft-tissue]
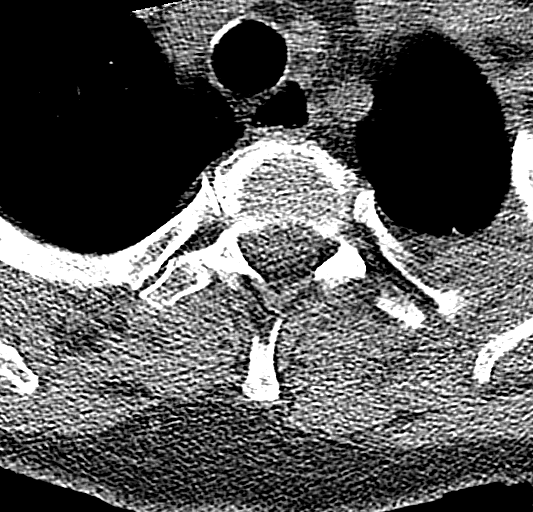
[im 21/104  bone]
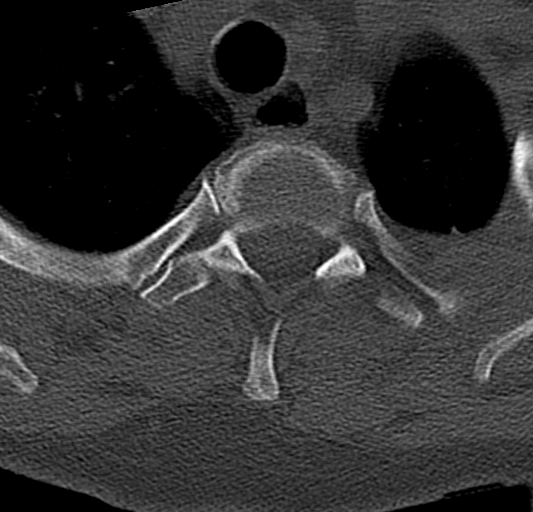
[im 42/104  bone]
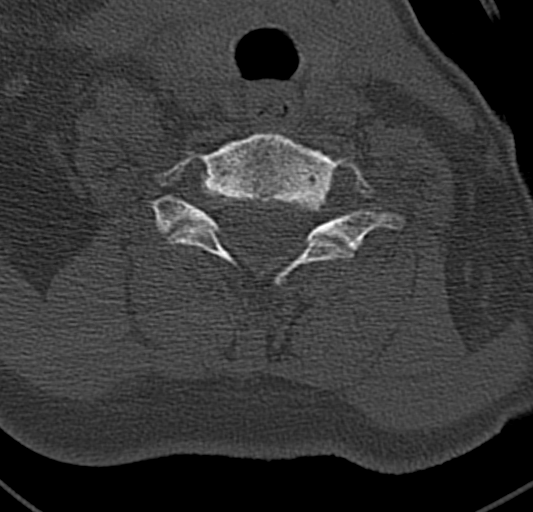
[im 62/104  bone]
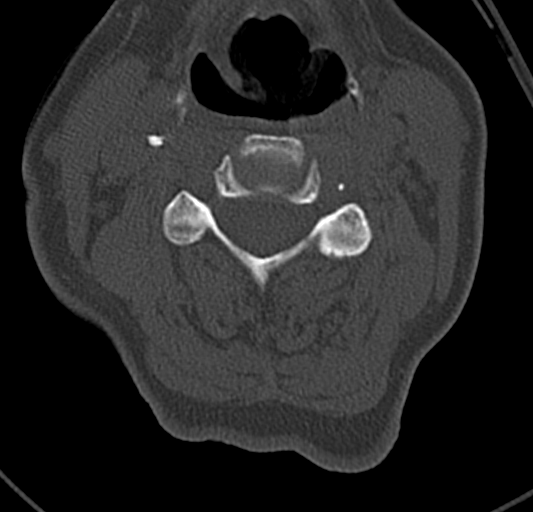
[im 83/104  bone]
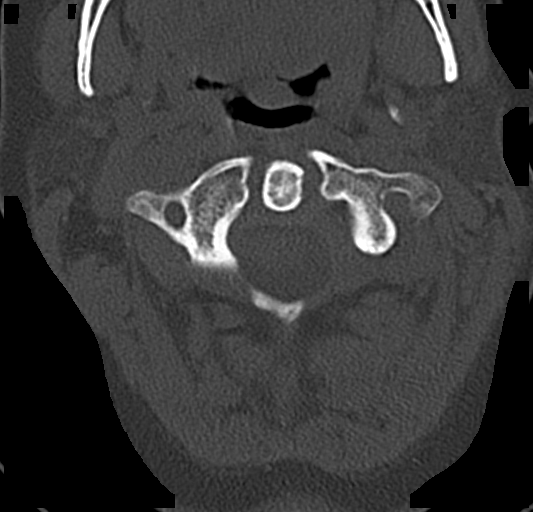

[13 of 33 positions shown; findings below may reference images not displayed]

FINDINGS: CT HEAD FINDINGS

Brain: Punctate hyperdensity in the left basal ganglia most
consistent with incidental mineralization. No intracranial
hemorrhage, mass effect, or midline shift. No hydrocephalus. The
basilar cisterns are patent. No evidence of territorial infarct or
acute ischemia. No extra-axial or intracranial fluid collection.

Vascular: No hyperdense vessel.

Skull: No fracture or focal lesion.

Sinuses/Orbits: Paranasal sinuses and mastoid air cells are clear.
The visualized orbits are unremarkable.

Other: None.

CT CERVICAL SPINE FINDINGS

Alignment: Straightening of normal lordosis. No traumatic
subluxation. Chest anterolisthesis is T1 on T2 and C7 on T1 is
likely degenerative.

Skull base and vertebrae: No acute fracture. Vertebral body heights
are maintained. The dens and skull base are intact.

Soft tissues and spinal canal: No prevertebral fluid or swelling. No
visible canal hematoma.

Disc levels: Disc space narrowing and endplate spurring seen from
C4-C5 through C6-C7. Scattered facet hypertrophy.

Upper chest: Negative.

Other: None.
IMPRESSION: 1. No acute intracranial abnormality. No skull fracture.
2. Degenerative change in the cervical spine without acute fracture
or subluxation.
